# Patient Record
Sex: Female | Born: 1985
Health system: Southern US, Community
[De-identification: ages and names within clinical notes are randomized; demographics above are authoritative.]

## PROBLEM LIST (undated history)

## (undated) ENCOUNTER — Inpatient Hospital Stay (HOSPITAL_COMMUNITY): Payer: Self-pay

## (undated) DIAGNOSIS — R87629 Unspecified abnormal cytological findings in specimens from vagina: Secondary | ICD-10-CM

## (undated) DIAGNOSIS — A6000 Herpesviral infection of urogenital system, unspecified: Secondary | ICD-10-CM

## (undated) DIAGNOSIS — IMO0002 Reserved for concepts with insufficient information to code with codable children: Secondary | ICD-10-CM

## (undated) HISTORY — DX: Herpesviral infection of urogenital system, unspecified: A60.00

## (undated) HISTORY — DX: Reserved for concepts with insufficient information to code with codable children: IMO0002

---
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
## 2007-11-02 ENCOUNTER — Inpatient Hospital Stay (HOSPITAL_COMMUNITY): Admission: AD | Admit: 2007-11-02 | Discharge: 2007-11-02 | Payer: Self-pay | Admitting: Obstetrics

## 2007-11-08 ENCOUNTER — Inpatient Hospital Stay (HOSPITAL_COMMUNITY): Admission: AD | Admit: 2007-11-08 | Discharge: 2007-11-13 | Payer: Self-pay | Admitting: Obstetrics

## 2007-11-08 ENCOUNTER — Encounter: Payer: Self-pay | Admitting: Obstetrics

## 2007-11-14 ENCOUNTER — Observation Stay (HOSPITAL_COMMUNITY): Admission: AD | Admit: 2007-11-14 | Discharge: 2007-11-15 | Payer: Self-pay | Admitting: Obstetrics

## 2008-08-30 ENCOUNTER — Ambulatory Visit (HOSPITAL_COMMUNITY): Admission: RE | Admit: 2008-08-30 | Discharge: 2008-08-30 | Payer: Self-pay | Admitting: Emergency Medicine

## 2009-10-10 ENCOUNTER — Emergency Department (HOSPITAL_COMMUNITY): Admission: EM | Admit: 2009-10-10 | Discharge: 2009-10-10 | Payer: Self-pay | Admitting: Emergency Medicine

## 2009-10-10 ENCOUNTER — Encounter (INDEPENDENT_AMBULATORY_CARE_PROVIDER_SITE_OTHER): Payer: Self-pay | Admitting: *Deleted

## 2010-02-10 ENCOUNTER — Encounter (INDEPENDENT_AMBULATORY_CARE_PROVIDER_SITE_OTHER): Payer: Self-pay | Admitting: *Deleted

## 2010-08-31 NOTE — Letter (Signed)
Summary: New Patient letter  [facility]  [address] [place], [place] [address]   Phone: [phone]  Fax: [phone]       02/10/2010 MRN: [id number]  Cheryl Stewart [address] [place], [place]  [address]  Dear Ms[facility],  Welcome to the Gastroenterology Division at [facility].    You are scheduled to see Dr.  Jarold Motto on 03-23-10 at 9:30a.m. on the 3rd floor at [facility], [address].  We ask that you try to arrive at our office 15 minutes prior to your appointment time to allow for check-in.  We would like you to complete the enclosed self-administered evaluation form prior to your visit and bring it with you on the day of your appointment.  We will review it with you.  Also, please bring a complete list of all your medications or, if you prefer, bring the medication bottles and we will list them.  Please bring your insurance card so that we may make a copy of it.  If your insurance requires a referral to see a specialist, please bring your referral form from your primary care physician.  Co-payments are due at the time of your visit and may be paid by cash, check or credit card.     Your office visit will consist of a consult with your physician (includes a physical exam), any laboratory testing he/she may order, scheduling of any necessary diagnostic testing (e.g. x-ray, ultrasound, CT-scan), and scheduling of a procedure (e.g. Endoscopy, Colonoscopy) if required.  Please allow enough time on your schedule to allow for any/all of these possibilities.    If you cannot keep your appointment, please call [phone] to cancel or reschedule prior to your appointment date.  This allows [place] the opportunity to schedule an appointment for another patient in need of care.  If you do not cancel or reschedule by 5 p.m. the business day prior to your appointment date, you will be charged a $50.00 late cancellation/no-show fee.    Thank you for  choosing [facility] for your medical needs.  We appreciate the opportunity to care for you.  Please visit [place] at our website  to learn more about our practice.                     Sincerely,                                                             The Gastroenterology Division

## 2010-10-25 LAB — DIFFERENTIAL
Basophils Absolute: 0 10*3/uL (ref 0.0–0.1)
Basophils Relative: 0 % (ref 0–1)
Eosinophils Absolute: 0 10*3/uL (ref 0.0–0.7)
Eosinophils Relative: 1 % (ref 0–5)
Lymphocytes Relative: 21 % (ref 12–46)
Lymphs Abs: 0.8 10*3/uL (ref 0.7–4.0)
Monocytes Absolute: 0.4 10*3/uL (ref 0.1–1.0)
Monocytes Relative: 11 % (ref 3–12)
Neutro Abs: 2.5 10*3/uL (ref 1.7–7.7)
Neutrophils Relative %: 68 % (ref 43–77)

## 2010-10-25 LAB — CBC
HCT: 35.8 % — ABNORMAL LOW (ref 36.0–46.0)
Hemoglobin: 12 g/dL (ref 12.0–15.0)
MCHC: 33.6 g/dL (ref 30.0–36.0)
MCV: 80.5 fL (ref 78.0–100.0)
Platelets: 142 10*3/uL — ABNORMAL LOW (ref 150–400)
RBC: 4.45 MIL/uL (ref 3.87–5.11)
RDW: 14.1 % (ref 11.5–15.5)
WBC: 3.7 10*3/uL — ABNORMAL LOW (ref 4.0–10.5)

## 2010-10-25 LAB — COMPREHENSIVE METABOLIC PANEL
ALT: 15 U/L (ref 0–35)
AST: 26 U/L (ref 0–37)
Albumin: 3.7 g/dL (ref 3.5–5.2)
Alkaline Phosphatase: 54 U/L (ref 39–117)
BUN: 7 mg/dL (ref 6–23)
CO2: 23 mEq/L (ref 19–32)
Calcium: 8.7 mg/dL (ref 8.4–10.5)
Chloride: 104 mEq/L (ref 96–112)
Creatinine, Ser: 0.75 mg/dL (ref 0.4–1.2)
GFR calc Af Amer: >60 mL/min (ref >60)
GFR calc non Af Amer: >60 mL/min (ref >60)
Glucose, Bld: 93 mg/dL (ref 70–99)
Potassium: 3.5 mEq/L (ref 3.5–5.1)
Sodium: 134 mEq/L — ABNORMAL LOW (ref 135–145)
Total Bilirubin: 0.6 mg/dL (ref 0.3–1.2)
Total Protein: 7.3 g/dL (ref 6.0–8.3)

## 2010-10-25 LAB — POCT PREGNANCY, URINE: Preg Test, Ur: NEGATIVE

## 2010-10-25 LAB — URINALYSIS, ROUTINE W REFLEX MICROSCOPIC
Bilirubin Urine: NEGATIVE
Glucose, UA: NEGATIVE mg/dL
Hgb urine dipstick: NEGATIVE
Ketones, ur: NEGATIVE mg/dL
Nitrite: NEGATIVE
Protein, ur: 30 mg/dL — AB
Specific Gravity, Urine: 1.028 (ref 1.005–1.030)
Urobilinogen, UA: 1 mg/dL (ref 0.0–1.0)
pH: 7 (ref 5.0–8.0)

## 2010-10-25 LAB — URINE MICROSCOPIC-ADD ON

## 2010-12-14 NOTE — Op Note (Signed)
Cheryl Stewart, CARE                ACCOUNT NO.:  [id number]   MEDICAL RECORD NO.:  [id number]          PATIENT TYPE:  INP   LOCATION:  9176                          FACILITY:  [facility]   PHYSICIAN:  Kathreen Cosier, M.D.DATE OF BIRTH:  [birth year]   DATE OF PROCEDURE:  11/10/2007  DATE OF DISCHARGE:                               OPERATIVE REPORT   PREOPERATIVE DIAGNOSIS:  Failure to progress in labor.   POSTOPERATIVE DIAGNOSIS:  Failure to progress in labor.   ANESTHESIA:  Spinal.   SURGEON:  Kathreen Cosier, M.D.   PROCEDURE:  Patient placed on the operating room table in a supine  position after the spinal administered.  Abdomen prepped and draped.  Bladder emptied with a Foley catheter.  A transverse suprapubic incision  made, carried down to the rectus fascia.  The fascia clean and incised  the length of the incision.  The recti muscles retracted laterally.  Peritoneum incised longitudinally.  A transverse incision made in the  visceroperitoneum of the bladder.  The bladder mobilized inferiorly.  Transverse low uterine incision made.  The patient delivered from the  LOA position of a female, Apgars 9, 9, weighing 8 pounds, 10 ounces.  The team was in attendance.  Fluid clear.  Placenta anterior and removed  manually and sent to labor and delivery.  Uterine cavity cleaned with  dry laps.  Uterine incision closed in one layer with continuous suture  of #1 chromic.  Hemostasis satisfactory.  Bladder flap reattached with 2-  0 chromic.  Uterus well contracted.  Tubes and ovaries normal.  Abdomen  closed in layers.  Peritoneum, continuous suture of 0 chromic fascia.  Continuous suture of 0 Dexon.  The skin closed with a subcuticular  stitch of 4-0 Monocryl.  Blood loss 800 cc.  Patient tolerated the  procedure well, taken to the recovery room in good condition.           ______________________________  Kathreen Cosier, M.D.     BAM/MEDQ  D:  11/10/2007  T:  11/10/2007   Job:  [id number]

## 2010-12-14 NOTE — H&P (Signed)
Cheryl Stewart, BENN NO.:  [id number]   MEDICAL RECORD NO.:  [id number]          PATIENT TYPE:  INP   LOCATION:  9176                          FACILITY:  [facility]   PHYSICIAN:  Kathreen Cosier, M.D.DATE OF BIRTH:  September 03, 1985   DATE OF ADMISSION:  11/08/2007  DATE OF DISCHARGE:                              HISTORY & PHYSICAL   HISTORY:  The patient is a 25 year old gravida 1, EDC October 30, 2007,  history of herpes on Valtrex.  She has had no lesions.  The patient  should have been induced on November 06, 2007, but refused.  Nonstress test  on the day of admission showed that she had some late decelerations.  Cervix was 1 cm, vertex -3 and she was contracting irregularly.  She was  given Cervidil the night of admission and she was contracting mildly on  November 09, 2007.  By 2 p.m. on November 09, 2007, she was in good labor.  Cervix was 1 cm, 80% vertex, -3. Fluid was clear.  The patient  progressed to 4 cm dilated.  By November 10, 2007, she had been in good  labor since 2 p.m. the day prior to admission.  Her cervix had not  changed from 4 cm over 6 hours.  The vertex was still -3.  It was  decided she would be delivered by C-section because of failure to  progress, failed induction.   LABORATORY DATA:  Hemoglobin 13.3, platelets 156.   PHYSICAL EXAMINATION:  GENERAL:  Well-developed female in labor.  HEENT:  Negative.  LUNGS:  Clear.  HEART:  Regular rhythm.  No murmurs or gallops.  BREASTS:  No masses.  ABDOMEN:  Term-sized uterus.  Estimated fetal weight 8 pounds and 8  ounces.  PELVIC:  As described above.  EXTREMITIES:  Negative.           ______________________________  Kathreen Cosier, M.D.     BAM/MEDQ  D:  11/10/2007  T:  11/10/2007  Job:  [id number]

## 2010-12-17 NOTE — Discharge Summary (Signed)
NAMETAJHA, SAMMARCO NO.:  [id number]   MEDICAL RECORD NO.:  [id number]           PATIENT TYPE:   LOCATION:                                 FACILITY:   PHYSICIAN:  Kathreen Cosier, M.D.DATE OF BIRTH:  01-20-1986   DATE OF ADMISSION:  DATE OF DISCHARGE:                               DISCHARGE SUMMARY   The patient is a 25 year old female gravida 1, EDC October 30, 2007, with  a history of herpes, was on Valtrex.  She was brought in for induction  because of post dates.  She had Cervidil and was in prolonged adequate  labor and eventually had a C-section for 8 pounds 10 ounces female,  Apgar 9 and 9.  Postop, she had urinary retention and had to be  catheterized every 6 hours.  Her hemoglobin postop was 9.5.  She was  started on Urecholine 25 mg p.o. q.i.d. and by third postop day residual  was less than 100.  She was discharged home on the third postop day on  Tylox for pain and Urecholine for urinary retention, to see me in 6  weeks.  Discharged home.   DIAGNOSES:  Status post primary low transverse cesarean section at term  for cephalopelvic disproportion and urinary retention.           ______________________________  Kathreen Cosier, M.D.     BAM/MEDQ  D:  12/12/2007  T:  12/12/2007  Job:  [id number]

## 2011-01-30 DIAGNOSIS — IMO0002 Reserved for concepts with insufficient information to code with codable children: Secondary | ICD-10-CM

## 2011-01-30 HISTORY — DX: Reserved for concepts with insufficient information to code with codable children: IMO0002

## 2011-01-30 HISTORY — PX: COLPOSCOPY: SHX161

## 2011-01-31 ENCOUNTER — Ambulatory Visit: Payer: Self-pay | Admitting: Gynecology

## 2011-01-31 ENCOUNTER — Other Ambulatory Visit: Payer: Self-pay | Admitting: Gynecology

## 2011-02-03 ENCOUNTER — Ambulatory Visit (INDEPENDENT_AMBULATORY_CARE_PROVIDER_SITE_OTHER): Payer: 59 | Admitting: Gynecology

## 2011-02-03 ENCOUNTER — Ambulatory Visit: Payer: Self-pay | Admitting: Gynecology

## 2011-02-03 DIAGNOSIS — N870 Mild cervical dysplasia: Secondary | ICD-10-CM

## 2011-04-26 LAB — URINALYSIS, ROUTINE W REFLEX MICROSCOPIC
Bilirubin Urine: NEGATIVE
Glucose, UA: NEGATIVE
Ketones, ur: NEGATIVE
Leukocytes, UA: NEGATIVE
Nitrite: NEGATIVE
Protein, ur: NEGATIVE
Specific Gravity, Urine: <1.005 — ABNORMAL LOW
Urobilinogen, UA: 0.2
pH: 5.5

## 2011-04-26 LAB — CBC
HCT: 27.1 — ABNORMAL LOW
HCT: 38.8
HCT: 38.9
Hemoglobin: 13.3
Hemoglobin: 13.4
Hemoglobin: 9.5 — ABNORMAL LOW
MCHC: 34.3
MCHC: 34.4
MCHC: 35.1
MCV: 85.2
MCV: 85.7
MCV: 86.5
Platelets: 118 — ABNORMAL LOW
Platelets: 156
Platelets: 169
RBC: 3.13 — ABNORMAL LOW
RBC: 4.55
RBC: 4.55
RDW: 14.4
RDW: 14.7
RDW: 14.7
WBC: 7.8
WBC: 9.3
WBC: 9.7

## 2011-04-26 LAB — COMPREHENSIVE METABOLIC PANEL
ALT: 9
ALT: 10
AST: 22
AST: 20
Albumin: 2.7 — ABNORMAL LOW
Albumin: 2 — ABNORMAL LOW
Alkaline Phosphatase: 136 — ABNORMAL HIGH
Alkaline Phosphatase: 94
BUN: 5 — ABNORMAL LOW
BUN: 8
CO2: 20
CO2: 26
Calcium: 9.5
Calcium: 8.4
Chloride: 105
Chloride: 110
Creatinine, Ser: 0.7
Creatinine, Ser: 1.27 — ABNORMAL HIGH
GFR calc Af Amer: >60
GFR calc Af Amer: >60
GFR calc non Af Amer: >60
GFR calc non Af Amer: 53 — ABNORMAL LOW
Glucose, Bld: 82
Glucose, Bld: 97
Potassium: 4.1
Potassium: 3.7
Sodium: 135
Sodium: 144
Total Bilirubin: 0.8
Total Bilirubin: 0.7
Total Protein: 6.2
Total Protein: 5.3 — ABNORMAL LOW

## 2011-04-26 LAB — LACTATE DEHYDROGENASE: LDH: 198

## 2011-04-26 LAB — URIC ACID: Uric Acid, Serum: 6.3

## 2011-04-26 LAB — URINE MICROSCOPIC-ADD ON

## 2011-04-26 LAB — RPR: RPR Ser Ql: NONREACTIVE

## 2011-07-19 ENCOUNTER — Other Ambulatory Visit (HOSPITAL_COMMUNITY)
Admission: RE | Admit: 2011-07-19 | Discharge: 2011-07-19 | Disposition: A | Discharge status: Home / Self Care | Payer: 59 | Source: Ambulatory Visit | Attending: Gynecology | Admitting: Gynecology

## 2011-07-19 ENCOUNTER — Encounter: Payer: Self-pay | Admitting: *Deleted

## 2011-07-19 ENCOUNTER — Ambulatory Visit (INDEPENDENT_AMBULATORY_CARE_PROVIDER_SITE_OTHER): Payer: 59 | Admitting: Gynecology

## 2011-07-19 DIAGNOSIS — IMO0002 Reserved for concepts with insufficient information to code with codable children: Secondary | ICD-10-CM

## 2011-07-19 DIAGNOSIS — B3731 Acute candidiasis of vulva and vagina: Secondary | ICD-10-CM

## 2011-07-19 DIAGNOSIS — N898 Other specified noninflammatory disorders of vagina: Secondary | ICD-10-CM

## 2011-07-19 DIAGNOSIS — R87612 Low grade squamous intraepithelial lesion on cytologic smear of cervix (LGSIL): Secondary | ICD-10-CM

## 2011-07-19 DIAGNOSIS — R109 Unspecified abdominal pain: Secondary | ICD-10-CM

## 2011-07-19 DIAGNOSIS — Z01419 Encounter for gynecological examination (general) (routine) without abnormal findings: Secondary | ICD-10-CM | POA: Insufficient documentation

## 2011-07-19 DIAGNOSIS — B373 Candidiasis of vulva and vagina: Secondary | ICD-10-CM

## 2011-07-19 MED ORDER — FLUCONAZOLE 150 MG PO TABS: 150.0000 mg | ORAL_TABLET | Freq: Once | ORAL | Status: AC

## 2011-07-19 NOTE — Patient Instructions (Signed)
Schedule and follow up for pelvic ultrasound. If Pap smear is normal or mild atypia then recommend repeat in 6 months when you're due for your annual exam.

## 2011-07-19 NOTE — Progress Notes (Signed)
Patient presents with a several week history of pelvic pain intermittently throughout the day it lasts minutes primarily in the left lower quadrant pelvic region. Also notes with intercourse she is having pain with deep penetration with every episode. She has no nausea or vomiting, diarrhea or constipation. No urinary symptoms such as frequency, dysuria or urgency. No fevers chills, no abnormal vaginal discharge, itching or odor. Menses are regular uses condom contraception. No history of similar pain in the past. She's actually due for her Pap smear repeat now at a six-month interval with low-grade SIL on colposcopy biopsy in July.  Exam chaperone present Spine straight no CVA tenderness Abdomen soft nontender without masses guarding rebound organomegaly with active bowel sounds. Pelvic external BUS vagina with white discharge, cervix normal, uterus anteverted normal size midline mobile nontender, adnexa without masses or tenderness, rectovaginal exam is normal.  Assessment and plan 1. White discharge. KOH wet prep is positive for yeast. We'll treat with Diflucan 150x1 dose follow up if symptoms develop. 2. Pelvic pain/dyspareunia. Of recent development. No history of this before. I reviewed with her she has a normal physical exam, my suspicion is she may have a small ovarian cyst, functional, that is responsible for her pain. I did a GC Chlamydia screen of the cervix. Her urinalysis is negative. She has no other symptoms to suggest other organ sites such as GI/GU. We'll start with pelvic ultrasound and she'll schedule that and follow up with me afterwards and then we'll go from there. Possibilities for laparoscopy for persistent or worsening pain was discussed with her. 3. Low-grade SIL. Pap smear was done today. Assuming normal or low grade then we'll plan repeat in the summer when she is due for her annual. If high grade then we'll triage based on results.

## 2011-07-19 NOTE — Progress Notes (Signed)
Addended by: Richardson Chiquito on: 07/19/2011 12:08 PM   Modules accepted: Orders

## 2011-07-20 LAB — GC/CHLAMYDIA PROBE AMP, GENITAL
Chlamydia, DNA Probe: NEGATIVE
GC Probe Amp, Genital: NEGATIVE

## 2011-08-08 ENCOUNTER — Encounter: Payer: Self-pay | Admitting: Gynecology

## 2011-08-08 ENCOUNTER — Ambulatory Visit (INDEPENDENT_AMBULATORY_CARE_PROVIDER_SITE_OTHER): Payer: 59 | Admitting: Gynecology

## 2011-08-08 ENCOUNTER — Ambulatory Visit (INDEPENDENT_AMBULATORY_CARE_PROVIDER_SITE_OTHER): Payer: 59

## 2011-08-08 DIAGNOSIS — R109 Unspecified abdominal pain: Secondary | ICD-10-CM

## 2011-08-08 DIAGNOSIS — N831 Corpus luteum cyst of ovary, unspecified side: Secondary | ICD-10-CM

## 2011-08-08 DIAGNOSIS — IMO0002 Reserved for concepts with insufficient information to code with codable children: Secondary | ICD-10-CM

## 2011-08-08 DIAGNOSIS — N949 Unspecified condition associated with female genital organs and menstrual cycle: Secondary | ICD-10-CM

## 2011-08-08 DIAGNOSIS — R102 Pelvic and perineal pain: Secondary | ICD-10-CM

## 2011-08-08 DIAGNOSIS — N83209 Unspecified ovarian cyst, unspecified side: Secondary | ICD-10-CM

## 2011-08-08 MED ORDER — FLUCONAZOLE 150 MG PO TABS: 150.0000 mg | ORAL_TABLET | Freq: Once | ORAL | Status: AC

## 2011-08-08 NOTE — Progress Notes (Addendum)
Patient presents for ultrasound do to her pelvic pain, dyspareunia. Ultrasound does show a hemorrhagic appearing left ovarian cyst 33 mm greatest dimension, 30 mm mean with a reticular echo pattern. Right ovary normal prominent endometrial canal in 15 mm noting that she is in her luteal phase. Her Pap smear from her visit did return normal.  Assessment and plan: 1. Pelvic pain/dyspareunia. Ultrasound does show a hemorrhagic cyst on the right. Differential to include endometriosis was reviewed with her. Recommend keeping a pain calender and repeat ultrasound in 2-3 months. If pain persists or cyst persists then discussed laparoscopy. If the cyst resolves and pain resolves then we'll plan active management. 2. Low-grade dysplasia. Pap smear returned normal and I recommended she repeat her Pap smear in the summer at a six-month interval she agrees with this. 3. Yeast vaginitis. Patient said that her pharmacy never got the Diflucan prescription from her previous visit although it has been documented it was e-prescribed. I reordered a Diflucan 150 mg a day and she knows to call me if there is any issue with this arriving at the pharmacy.

## 2011-08-08 NOTE — Patient Instructions (Signed)
Keep track of your pain. Repeat ultrasound in 2-3 months. If pain or ovarian cyst persists then we'll proceed with laparoscopy. Repeat Pap smear in 6 months.

## 2011-08-09 ENCOUNTER — Telehealth: Payer: Self-pay | Admitting: *Deleted

## 2011-08-09 MED ORDER — IBUPROFEN 800 MG PO TABS: 800.0000 mg | ORAL_TABLET | Freq: Three times a day (TID) | ORAL | Status: AC | PRN

## 2011-08-09 NOTE — Telephone Encounter (Signed)
I would suggest starting with prescription strength Motrin 800 mg Q8 hours when necessary pain #30 2 refills prescribed

## 2011-08-09 NOTE — Telephone Encounter (Signed)
Pt seen yesterday for pelvic pain for ultrasound. Pt said that she is still having sharp pain, pt keep asking about a prescription for the pain and I told her that the rx that was sent was for her diflucan from previous visit. I explained to pt what you had in office note what the plan and assessment. Pt wants to know what can she do now to stop pain, OTC medication are not working. Please advise

## 2011-08-09 NOTE — Telephone Encounter (Signed)
Left the below message on pt vm with the below note

## 2011-08-10 ENCOUNTER — Telehealth: Payer: Self-pay | Admitting: *Deleted

## 2011-08-10 MED ORDER — TRAMADOL HCL 50 MG PO TABS: 50.0000 mg | ORAL_TABLET | Freq: Four times a day (QID) | ORAL | Status: AC | PRN

## 2011-08-10 NOTE — Telephone Encounter (Signed)
We will try Ultram 50 mg by mouth every 6 hours when necessary pain #30 no refill

## 2011-08-10 NOTE — Telephone Encounter (Signed)
Pt informed with the below note. 

## 2011-08-10 NOTE — Telephone Encounter (Signed)
Pt called was seen on 08/08/11 for pelvic pain and had ultrasound done, pt called yesterday c/o same pain and was given motrin 800 mg 1 every 8 hours as needed. Pt called again today saying her pain hurts too bad, she said the motrin is not working. She is taking every 8 hour and no relief, said it hurts to even cough. Please advise

## 2013-04-20 ENCOUNTER — Ambulatory Visit: Payer: BC Managed Care – PPO | Admitting: Family Medicine

## 2013-04-20 ENCOUNTER — Other Ambulatory Visit: Payer: Self-pay | Admitting: *Deleted

## 2013-04-20 VITALS — BP 112/64 | HR 103 | Temp 99.4°F | Resp 17 | Ht 62.5 in | Wt 158.0 lb

## 2013-04-20 DIAGNOSIS — J329 Chronic sinusitis, unspecified: Secondary | ICD-10-CM

## 2013-04-20 MED ORDER — FLUCONAZOLE 150 MG PO TABS: 150.0000 mg | ORAL_TABLET | Freq: Once | ORAL | Status: DC

## 2013-04-20 MED ORDER — AMOXICILLIN 875 MG PO TABS: 875.0000 mg | ORAL_TABLET | Freq: Two times a day (BID) | ORAL | Status: DC

## 2013-04-20 NOTE — Progress Notes (Signed)
    Patient ID: Cheryl Stewart MRN: [id number], DOB: 1986-03-06, 27 y.o. Date of Encounter: 04/20/2013, 2:08 PM  Primary Physician: No PCP Per Patient  Chief Complaint:  Chief Complaint  Patient presents with  . Headache  . Dizziness    HPI: 27 y.o. year old female presents with 7 day history of nasal congestion, post nasal drip, sore throat, sinus pressure, and cough. Afebrile. No chills. Nasal congestion thick and green/yellow. Sinus pressure is the worst symptom. Cough is productive secondary to post nasal drip and not associated with time of day. Ears feel full, leading to sensation of muffled hearing. Has tried OTC cold preps without success. No GI complaints.  Patient is from [place].  She has 27 yo kindergartner.  Works for [employer] at [employer]. No recent antibiotics, recent travels, or sick contacts   No leg trauma, sedentary periods, h/o cancer, or tobacco use.  Past Medical History  Diagnosis Date  . LGSIL (low grade squamous intraepithelial dysplasia) 07/12     Home Meds: Prior to Admission medications   Not on File    Allergies: No Known Allergies  History   Social History  . Marital Status: Single    Spouse Name: N/A    Number of Children: N/A  . Years of Education: N/A   Occupational History  . Not on file.   Social History Main Topics  . Smoking status: Never Smoker   . Smokeless tobacco: Never Used  . Alcohol Use: No  . Drug Use: No  . Sexual Activity: Yes    Birth Control/ Protection: Condom   Other Topics Concern  . Not on file   Social History Narrative  . No narrative on file     Review of Systems: Constitutional: negative for chills, fever, night sweats or weight changes Cardiovascular: negative for chest pain or palpitations Respiratory: negative for hemoptysis, wheezing, or shortness of breath Abdominal: negative for abdominal pain, nausea, vomiting or diarrhea Dermatological: negative for rash Neurologic: negative for  headache   Physical Exam: Blood pressure 112/64, pulse 103, temperature 99.4 F (37.4 C), temperature source Oral, resp. rate 17, height 5' 2.5" (1.588 m), weight 158 lb (71.668 kg), SpO2 99.00%., Body mass index is 28.42 kg/(m^2). General: Well developed, well nourished, in no acute distress. Head: Normocephalic, atraumatic, eyes without discharge, sclera non-icteric, nares are congested. Bilateral auditory canals clear, TM's are without perforation, pearly grey with reflective cone of light bilaterally. Serous effusion bilaterally behind TM's. Maxillary sinus TTP. Oral cavity moist, dentition normal. Posterior pharynx with post nasal drip and mild erythema. No peritonsillar abscess or tonsillar exudate. Neck: Supple. No thyromegaly. Full ROM. No lymphadenopathy. Lungs: Clear bilaterally to auscultation without wheezes, rales, or rhonchi. Breathing is unlabored.  Heart: RRR with S1 S2. No murmurs, rubs, or gallops appreciated. Msk:  Strength and tone normal for age. Extremities: No clubbing or cyanosis. No edema. Neuro: Alert and oriented X 3. Moves all extremities spontaneously. CNII-XII grossly in tact. Psych:  Responds to questions appropriately with a normal affect.    ASSESSMENT AND PLAN:  27 y.o. year old female with sinusitis  -  -Tylenol/Motrin prn -Rest/fluids -RTC precautions -RTC 3-5 days if no improvement  Signed, Elvina Sidle, MD 04/20/2013 2:08 PM

## 2013-04-20 NOTE — Patient Instructions (Addendum)

## 2014-02-14 ENCOUNTER — Observation Stay (HOSPITAL_COMMUNITY)
Admission: EM | Admit: 2014-02-14 | Discharge: 2014-02-15 | Disposition: A | Discharge status: Home / Self Care | Payer: BC Managed Care – PPO | Attending: Obstetrics | Admitting: Obstetrics

## 2014-02-14 ENCOUNTER — Encounter (HOSPITAL_COMMUNITY): Payer: Self-pay | Admitting: Emergency Medicine

## 2014-02-14 DIAGNOSIS — M79609 Pain in unspecified limb: Secondary | ICD-10-CM | POA: Insufficient documentation

## 2014-02-14 DIAGNOSIS — R109 Unspecified abdominal pain: Secondary | ICD-10-CM

## 2014-02-14 DIAGNOSIS — Z331 Pregnant state, incidental: Secondary | ICD-10-CM | POA: Diagnosis not present

## 2014-02-14 DIAGNOSIS — Z3493 Encounter for supervision of normal pregnancy, unspecified, third trimester: Secondary | ICD-10-CM

## 2014-02-14 DIAGNOSIS — R1032 Left lower quadrant pain: Principal | ICD-10-CM | POA: Insufficient documentation

## 2014-02-14 MED ORDER — PRENATAL MULTIVITAMIN CH
1.0000 | ORAL_TABLET | Freq: Every day | ORAL | Status: DC
Start: 2014-02-15 — End: 2014-02-15

## 2014-02-14 MED ORDER — ZOLPIDEM TARTRATE 5 MG PO TABS: 5.0000 mg | ORAL_TABLET | Freq: Every evening | ORAL | Status: DC | PRN

## 2014-02-14 MED ORDER — ACETAMINOPHEN 325 MG PO TABS: 650.0000 mg | ORAL_TABLET | ORAL | Status: DC | PRN

## 2014-02-14 MED ORDER — SODIUM CHLORIDE 0.9 % IJ SOLN
3.0000 mL | Freq: Two times a day (BID) | INTRAMUSCULAR | Status: DC
Start: 2014-02-14 — End: 2014-02-15
  Administered 2014-02-14: 3 mL via INTRAVENOUS

## 2014-02-14 MED ORDER — SODIUM CHLORIDE 0.9 % IJ SOLN: 3.0000 mL | INTRAMUSCULAR | Status: DC | PRN

## 2014-02-14 MED ORDER — CALCIUM CARBONATE ANTACID 500 MG PO CHEW
2.0000 | CHEWABLE_TABLET | ORAL | Status: DC | PRN
Start: 2014-02-14 — End: 2014-02-15
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
## 2014-02-14 NOTE — ED Notes (Signed)
[facility] called to transport patient. [facility] nurse at bedside. Pt resting quietly at the time. Vital signs stable. No signs of distress noted.

## 2014-02-14 NOTE — ED Notes (Signed)
[facility] at bedside to transport patient. Vital signs stable. All required paperwork and transfer information sent with patient.
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
## 2014-02-14 NOTE — ED Notes (Signed)
[facility]apid [facility] nurse at bedside 

## 2014-02-14 NOTE — ED Notes (Signed)
NO IV fluids ordered at the time per Dr. Venora Maples.
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
## 2014-02-15 DIAGNOSIS — R1032 Left lower quadrant pain: Secondary | ICD-10-CM | POA: Diagnosis not present

## 2014-02-15 NOTE — Discharge Instructions (Signed)
Fetal Movement Counts Patient Name: __________________________________________________ Patient Due Date: ____________________ Performing a fetal movement count is highly recommended in high-risk pregnancies, but it is good for every pregnant woman to do. Your caregiver may ask you to start counting fetal movements at 28 weeks of the pregnancy. Fetal movements often increase:  After eating a full meal.  After physical activity.  After eating or drinking something sweet or cold.  At rest. Pay attention to when you feel the baby is most active. This will help you notice a pattern of your baby's sleep and wake cycles and what factors contribute to an increase in fetal movement. It is important to perform a fetal movement count at the same time each day when your baby is normally most active.  HOW TO COUNT FETAL MOVEMENTS 1. Find a quiet and comfortable area to sit or lie down on your left side. Lying on your left side provides the best blood and oxygen circulation to your baby. 2. Write down the day and time on a sheet of paper or in a journal. 3. Start counting kicks, flutters, swishes, rolls, or jabs in a 2 hour period. You should feel at least 10 movements within 2 hours. 4. If you do not feel 10 movements in 2 hours, wait 2-3 hours and count again. Look for a change in the pattern or not enough counts in 2 hours. SEEK MEDICAL CARE IF:  You feel less than 10 counts in 2 hours, tried twice.  There is no movement in over an hour.  The pattern is changing or taking longer each day to reach 10 counts in 2 hours.  You feel the baby is not moving as he or she usually does. Date: ____________ Movements: ____________ Start time: ____________ Elizebeth Koller time: ____________  Date: ____________ Movements: ____________ Start time: ____________ Elizebeth Koller time: ____________ Date: ____________ Movements: ____________ Start time: ____________ Elizebeth Koller time: ____________ Date: ____________ Movements: ____________  Start time: ____________ Elizebeth Koller time: ____________ Date: ____________ Movements: ____________ Start time: ____________ Elizebeth Koller time: ____________ Date: ____________ Movements: ____________ Start time: ____________ Elizebeth Koller time: ____________ Date: ____________ Movements: ____________ Start time: ____________ Elizebeth Koller time: ____________ Date: ____________ Movements: ____________ Start time: ____________ Elizebeth Koller time: ____________  Date: ____________ Movements: ____________ Start time: ____________ Elizebeth Koller time: ____________ Date: ____________ Movements: ____________ Start time: ____________ Elizebeth Koller time: ____________ Date: ____________ Movements: ____________ Start time: ____________ Elizebeth Koller time: ____________ Date: ____________ Movements: ____________ Start time: ____________ Elizebeth Koller time: ____________ Date: ____________ Movements: ____________ Start time: ____________ Elizebeth Koller time: ____________ Date: ____________ Movements: ____________ Start time: ____________ Elizebeth Koller time: ____________ Date: ____________ Movements: ____________ Start time: ____________ Elizebeth Koller time: ____________  Date: ____________ Movements: ____________ Start time: ____________ Elizebeth Koller time: ____________ Date: ____________ Movements: ____________ Start time: ____________ Elizebeth Koller time: ____________ Date: ____________ Movements: ____________ Start time: ____________ Elizebeth Koller time: ____________ Date: ____________ Movements: ____________ Start time: ____________ Elizebeth Koller time: ____________ Date: ____________ Movements: ____________ Start time: ____________ Elizebeth Koller time: ____________ Date: ____________ Movements: ____________ Start time: ____________ Elizebeth Koller time: ____________ Date: ____________ Movements: ____________ Start time: ____________ Elizebeth Koller time: ____________  Date: ____________ Movements: ____________ Start time: ____________ Elizebeth Koller time: ____________ Date: ____________ Movements: ____________ Start time: ____________ Elizebeth Koller time:  ____________ Date: ____________ Movements: ____________ Start time: ____________ Elizebeth Koller time: ____________ Date: ____________ Movements: ____________ Start time: ____________ Elizebeth Koller time: ____________ Date: ____________ Movements: ____________ Start time: ____________ Elizebeth Koller time: ____________ Date: ____________ Movements: ____________ Start time: ____________ Elizebeth Koller time: ____________ Date: ____________ Movements: ____________ Start time: ____________ Elizebeth Koller time: ____________  Date: ____________ Movements: ____________ Start time: ____________ Elizebeth Koller time:  ____________ Date: ____________ Movements: ____________ Start time: ____________ Elizebeth Koller time: ____________ Date: ____________ Movements: ____________ Start time: ____________ Elizebeth Koller time: ____________ Date: ____________ Movements: ____________ Start time: ____________ Elizebeth Koller time: ____________ Date: ____________ Movements: ____________ Start time: ____________ Elizebeth Koller time: ____________ Date: ____________ Movements: ____________ Start time: ____________ Elizebeth Koller time: ____________ Date: ____________ Movements: ____________ Start time: ____________ Elizebeth Koller time: ____________  Date: ____________ Movements: ____________ Start time: ____________ Elizebeth Koller time: ____________ Date: ____________ Movements: ____________ Start time: ____________ Elizebeth Koller time: ____________ Date: ____________ Movements: ____________ Start time: ____________ Elizebeth Koller time: ____________ Date: ____________ Movements: ____________ Start time: ____________ Elizebeth Koller time: ____________ Date: ____________ Movements: ____________ Start time: ____________ Elizebeth Koller time: ____________ Date: ____________ Movements: ____________ Start time: ____________ Elizebeth Koller time: ____________ Date: ____________ Movements: ____________ Start time: ____________ Elizebeth Koller time: ____________  Date: ____________ Movements: ____________ Start time: ____________ Elizebeth Koller time: ____________ Date: ____________ Movements:  ____________ Start time: ____________ Elizebeth Koller time: ____________ Date: ____________ Movements: ____________ Start time: ____________ Elizebeth Koller time: ____________ Date: ____________ Movements: ____________ Start time: ____________ Elizebeth Koller time: ____________ Date: ____________ Movements: ____________ Start time: ____________ Elizebeth Koller time: ____________ Date: ____________ Movements: ____________ Start time: ____________ Elizebeth Koller time: ____________ Date: ____________ Movements: ____________ Start time: ____________ Elizebeth Koller time: ____________  Date: ____________ Movements: ____________ Start time: ____________ Elizebeth Koller time: ____________ Date: ____________ Movements: ____________ Start time: ____________ Elizebeth Koller time: ____________ Date: ____________ Movements: ____________ Start time: ____________ Elizebeth Koller time: ____________ Date: ____________ Movements: ____________ Start time: ____________ Elizebeth Koller time: ____________ Date: ____________ Movements: ____________ Start time: ____________ Elizebeth Koller time: ____________ Date: ____________ Movements: ____________ Start time: ____________ Elizebeth Koller time: ____________ Document Released: 08/17/2006 Document Revised: 07/04/2012 Document Reviewed: 05/14/2012 [employer] Patient Information 2015 [employer]. This information is not intended to replace advice given to you by your health care provider. Make sure you discuss any questions you have with your health care provider. Preterm Labor Information Preterm labor is when labor starts at less than 37 weeks of pregnancy. The normal length of a pregnancy is 39 to 41 weeks. CAUSES Often, there is no identifiable underlying cause as to why a woman goes into preterm labor. One of the most common known causes of preterm labor is infection. Infections of the uterus, cervix, vagina, amniotic sac, bladder, kidney, or even the lungs (pneumonia) can cause labor to start. Other suspected causes of preterm labor include:   Urogenital infections,  such as yeast infections and bacterial vaginosis.   Uterine abnormalities (uterine shape, uterine septum, fibroids, or bleeding from the placenta).   A cervix that has been operated on (it may fail to stay closed).   Malformations in the fetus.   Multiple gestations (twins, triplets, and so on).   Breakage of the amniotic sac.  RISK FACTORS  Having a previous history of preterm labor.   Having premature rupture of membranes (PROM).   Having a placenta that covers the opening of the cervix (placenta previa).   Having a placenta that separates from the uterus (placental abruption).   Having a cervix that is too weak to hold the fetus in the uterus (incompetent cervix).   Having too much fluid in the amniotic sac (polyhydramnios).   Taking illegal drugs or smoking while pregnant.   Not gaining enough weight while pregnant.   Being younger than 27 and older than 28 years old.   Having a low socioeconomic status.   Being African American. SYMPTOMS Signs and symptoms of preterm labor include:   Menstrual-like cramps, abdominal pain, or back pain.  Uterine contractions that are regular, as frequent as six in an hour,  regardless of their intensity (may be mild or painful).  Contractions that start on the top of the uterus and spread down to the lower abdomen and back.   A sense of increased pelvic pressure.   A watery or bloody mucus discharge that comes from the vagina.  TREATMENT Depending on the length of the pregnancy and other circumstances, your health care provider may suggest bed rest. If necessary, there are medicines that can be given to stop contractions and to mature the fetal lungs. If labor happens before 34 weeks of pregnancy, a prolonged hospital stay may be recommended. Treatment depends on the condition of both you and the fetus.  WHAT SHOULD YOU DO IF YOU THINK YOU ARE IN PRETERM LABOR? Call your health care provider right away. You will  need to go to the hospital to get checked immediately. HOW CAN YOU PREVENT PRETERM LABOR IN FUTURE PREGNANCIES? You should:   Stop smoking if you smoke.  Maintain healthy weight gain and avoid chemicals and drugs that are not necessary.  Be watchful for any type of infection.  Inform your health care provider if you have a known history of preterm labor. Document Released: 10/08/2003 Document Revised: 03/20/2013 Document Reviewed: 08/20/2012 [facility] Patient Information 2015 [place], [place]. This information is not intended to replace advice given to you by your health care provider. Make sure you discuss any questions you have with your health care provider.

## 2014-02-15 NOTE — Progress Notes (Signed)
Patient discharged home.
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
## 2014-03-19 ENCOUNTER — Inpatient Hospital Stay (HOSPITAL_COMMUNITY)
Admission: AD | Admit: 2014-03-19 | Discharge: 2014-03-19 | Disposition: A | Discharge status: Home / Self Care | Payer: Medicaid Other | Source: Ambulatory Visit | Attending: Obstetrics | Admitting: Obstetrics

## 2014-03-19 ENCOUNTER — Encounter (HOSPITAL_COMMUNITY): Payer: Self-pay | Admitting: *Deleted

## 2014-03-19 DIAGNOSIS — O9989 Other specified diseases and conditions complicating pregnancy, childbirth and the puerperium: Principal | ICD-10-CM

## 2014-03-19 DIAGNOSIS — R0602 Shortness of breath: Secondary | ICD-10-CM | POA: Diagnosis not present

## 2014-03-19 DIAGNOSIS — D649 Anemia, unspecified: Secondary | ICD-10-CM | POA: Diagnosis not present

## 2014-03-19 DIAGNOSIS — R5383 Other fatigue: Secondary | ICD-10-CM | POA: Diagnosis not present

## 2014-03-19 DIAGNOSIS — O99891 Other specified diseases and conditions complicating pregnancy: Secondary | ICD-10-CM | POA: Diagnosis not present

## 2014-03-19 DIAGNOSIS — R109 Unspecified abdominal pain: Secondary | ICD-10-CM | POA: Diagnosis present

## 2014-03-19 DIAGNOSIS — O99019 Anemia complicating pregnancy, unspecified trimester: Secondary | ICD-10-CM | POA: Insufficient documentation

## 2014-03-19 DIAGNOSIS — R5381 Other malaise: Secondary | ICD-10-CM | POA: Diagnosis not present

## 2014-03-19 DIAGNOSIS — N390 Urinary tract infection, site not specified: Secondary | ICD-10-CM

## 2014-03-19 LAB — URINE MICROSCOPIC-ADD ON

## 2014-03-19 LAB — URINALYSIS, ROUTINE W REFLEX MICROSCOPIC
Bilirubin Urine: NEGATIVE
Glucose, UA: NEGATIVE mg/dL
Hgb urine dipstick: NEGATIVE
Ketones, ur: NEGATIVE mg/dL
Leukocytes, UA: NEGATIVE
Nitrite: NEGATIVE
Protein, ur: 100 mg/dL — AB
Specific Gravity, Urine: 1.02 (ref 1.005–1.030)
Urobilinogen, UA: 0.2 mg/dL (ref 0.0–1.0)
pH: 7 (ref 5.0–8.0)

## 2014-03-19 LAB — CBC
HCT: 29.5 % — ABNORMAL LOW (ref 36.0–46.0)
Hemoglobin: 9.6 g/dL — ABNORMAL LOW (ref 12.0–15.0)
MCH: 25.9 pg — ABNORMAL LOW (ref 26.0–34.0)
MCHC: 32.5 g/dL (ref 30.0–36.0)
MCV: 79.5 fL (ref 78.0–100.0)
Platelets: 157 10*3/uL (ref 150–400)
RBC: 3.71 MIL/uL — ABNORMAL LOW (ref 3.87–5.11)
RDW: 14.7 % (ref 11.5–15.5)
WBC: 6.9 10*3/uL (ref 4.0–10.5)

## 2014-03-19 MED ORDER — NITROFURANTOIN MONOHYD MACRO 100 MG PO CAPS: 100.0000 mg | ORAL_CAPSULE | Freq: Two times a day (BID) | ORAL | Status: DC

## 2014-03-19 MED ORDER — FERROUS SULFATE 325 (65 FE) MG PO TBEC: 325.0000 mg | DELAYED_RELEASE_TABLET | Freq: Two times a day (BID) | ORAL | Status: DC

## 2014-03-19 NOTE — MAU Provider Note (Signed)
History     CSN: [id number]  Arrival date and time: 03/19/14 1151   None     Chief Complaint  Patient presents with  . Abdominal Pain   HPI This is a 28 y.o. female at [redacted]w[redacted]d who presents with c/o multiple somatic complaints. States had a sudden onset of malaise, shortness of breath, and weakness. Did not call Dr Ruthann Cancer, but instead called EMS. Feels better now but still feels weak. Denies contractions, leaking or bleeding. + FM.   States has eaten once today, some cereal. Has not drunk any fluids AT ALL all day.  States chews ice a lot. States very rarely drinks any fluids.  States has always been that way. Cannot say she drinks even one cup of water in a whole day.   RN Note  Patient states she does not think the pain she is feeling is contractions. States she feels SOB, hurts all over, feels weak. Was thinking it was because her iron is low.        OB History   Grav Para Term Preterm Abortions TAB SAB Ect Mult Living   4 1   2 2    1       Past Medical History  Diagnosis Date  . LGSIL (low grade squamous intraepithelial dysplasia) 07/12    Past Surgical History  Procedure Laterality Date  . Colposcopy  01/2011  . Cesarean section  2009    Family History  Problem Relation Age of Onset  . Hypertension Father     History  Substance Use Topics  . Smoking status: Never Smoker   . Smokeless tobacco: Never Used  . Alcohol Use: No    Allergies: No Known Allergies  Prescriptions prior to admission  Medication Sig Dispense Refill  . acetaminophen (TYLENOL) 500 MG tablet Take 500 mg by mouth every 6 (six) hours as needed for mild pain.      . calcium carbonate (TUMS - DOSED IN MG ELEMENTAL CALCIUM) 500 MG chewable tablet Chew 1 tablet by mouth as needed for indigestion or heartburn.        Review of Systems  Constitutional: Positive for malaise/fatigue. Negative for fever and chills.  Eyes: Negative for blurred vision.  Respiratory: Positive for shortness of  breath (previously, not now). Negative for cough and wheezing.   Cardiovascular: Negative for chest pain.  Gastrointestinal: Negative for heartburn, nausea, vomiting and abdominal pain.  Genitourinary: Negative for dysuria.  Musculoskeletal: Positive for myalgias.  Neurological: Positive for weakness. Negative for dizziness, sensory change, speech change, focal weakness, loss of consciousness and headaches.   Physical Exam   Blood pressure 107/62, pulse 91, temperature 98 F (36.7 C), temperature source Oral, SpO2 100.00%.  Physical Exam  Constitutional: She is oriented to person, place, and time. She appears well-developed and well-nourished. No distress.  HENT:  Head: Normocephalic.  Cardiovascular: Normal rate, regular rhythm and normal heart sounds.  Exam reveals no gallop and no friction rub.   No murmur heard. Respiratory: Effort normal and breath sounds normal. No respiratory distress. She has no wheezes. She has no rales. She exhibits no tenderness.  GI: Soft. Bowel sounds are normal. She exhibits no distension. There is no tenderness. There is no rebound and no guarding.  Genitourinary:  Dilation: Closed Effacement (%): Thick Cervical Position: Middle Station: Ballotable Presentation: Undeterminable Exam by:: Lavonna Rua, RNC   Musculoskeletal: Normal range of motion.  Neurological: She is alert and oriented to person, place, and time.  Skin: Skin is warm  and dry.  Psychiatric: She has a normal mood and affect.    MAU Course  Procedures  MDM Results for orders placed during the hospital encounter of 03/19/14 (from the past 24 hour(s))  URINALYSIS, ROUTINE W REFLEX MICROSCOPIC     Status: Abnormal   Collection Time    03/19/14  1:24 PM      Result Value Ref Range   Color, Urine YELLOW  YELLOW   APPearance CLEAR  CLEAR   Specific Gravity, Urine 1.020  1.005 - 1.030   pH 7.0  5.0 - 8.0   Glucose, UA NEGATIVE  NEGATIVE mg/dL   Hgb urine dipstick NEGATIVE  NEGATIVE    Bilirubin Urine NEGATIVE  NEGATIVE   Ketones, ur NEGATIVE  NEGATIVE mg/dL   Protein, ur 100 (*) NEGATIVE mg/dL   Urobilinogen, UA 0.2  0.0 - 1.0 mg/dL   Nitrite NEGATIVE  NEGATIVE   Leukocytes, UA NEGATIVE  NEGATIVE  URINE MICROSCOPIC-ADD ON     Status: Abnormal   Collection Time    03/19/14  1:24 PM      Result Value Ref Range   Squamous Epithelial / LPF FEW (*) RARE   WBC, UA 3-6  <3 WBC/hpf   RBC / HPF 3-6  <3 RBC/hpf   Bacteria, UA MANY (*) RARE   Urine-Other MUCOUS PRESENT    CBC     Status: Abnormal   Collection Time    03/19/14  1:30 PM      Result Value Ref Range   WBC 6.9  4.0 - 10.5 K/uL   RBC 3.71 (*) 3.87 - 5.11 MIL/uL   Hemoglobin 9.6 (*) 12.0 - 15.0 g/dL   HCT 29.5 (*) 36.0 - 46.0 %   MCV 79.5  78.0 - 100.0 fL   MCH 25.9 (*) 26.0 - 34.0 pg   MCHC 32.5  30.0 - 36.0 g/dL   RDW 14.7  11.5 - 15.5 %   Platelets 157  150 - 400 K/uL    NST reactive Rare contractions  Assessment and Plan  A:  SIUp at [redacted]w[redacted]d        Probable UTI       Malaise and weakness, probably due to Anemia and dehydration       Poor water intake       Anemia       Episode of shortness of breath, probably panic attack  P:  Dicussed with patient       Discharge home       Encouraged PO fluids intake       RX  Iron bid       Rx macrobid       Urine to culture       Followup with Dr Jefm Miles 03/19/2014, 1:26 PM

## 2014-03-19 NOTE — MAU Note (Signed)
Patient states she does not think the pain she is feeling is contractions. States she feels SOB, hurts all over, feels weak. Was thinking it was because her iron is low.

## 2014-03-19 NOTE — MAU Note (Signed)
Pt presents to MAU with complaints of having abdominal pain that started this morning and complains of having trouble catching her breath. Denies any vaginal bleeding or LOF

## 2014-03-19 NOTE — Discharge Instructions (Signed)
Dehydration, Adult °Dehydration is when you lose more fluids from the body than you take in. Vital organs like the kidneys, brain, and heart cannot function without a proper amount of fluids and salt. Any loss of fluids from the body can cause dehydration.  °CAUSES  °· Vomiting. °· Diarrhea. °· Excessive sweating. °· Excessive urine output. °· Fever. °SYMPTOMS  °Mild dehydration °· Thirst. °· Dry lips. °· Slightly dry mouth. °Moderate dehydration °· Very dry mouth. °· Sunken eyes. °· Skin does not bounce back quickly when lightly pinched and released. °· Dark urine and decreased urine production. °· Decreased tear production. °· Headache. °Severe dehydration °· Very dry mouth. °· Extreme thirst. °· Rapid, weak pulse (more than 100 beats per minute at rest). °· Cold hands and feet. °· Not able to sweat in spite of heat and temperature. °· Rapid breathing. °· Blue lips. °· Confusion and lethargy. °· Difficulty being awakened. °· Minimal urine production. °· No tears. °DIAGNOSIS  °Your caregiver will diagnose dehydration based on your symptoms and your exam. Blood and urine tests will help confirm the diagnosis. The diagnostic evaluation should also identify the cause of dehydration. °TREATMENT  °Treatment of mild or moderate dehydration can often be done at home by increasing the amount of fluids that you drink. It is best to drink small amounts of fluid more often. Drinking too much at one time can make vomiting worse. Refer to the home care instructions below. °Severe dehydration needs to be treated at the hospital where you will probably be given intravenous (IV) fluids that contain water and electrolytes. °HOME CARE INSTRUCTIONS  °· Ask your caregiver about specific rehydration instructions. °· Drink enough fluids to keep your urine clear or pale yellow. °· Drink small amounts frequently if you have nausea and vomiting. °· Eat as you normally do. °· Avoid: °¨ Foods or drinks high in sugar. °¨ Carbonated  drinks. °¨ Juice. °¨ Extremely hot or cold fluids. °¨ Drinks with caffeine. °¨ Fatty, greasy foods. °¨ Alcohol. °¨ Tobacco. °¨ Overeating. °¨ Gelatin desserts. °· Wash your hands well to avoid spreading bacteria and viruses. °· Only take over-the-counter or prescription medicines for pain, discomfort, or fever as directed by your caregiver. °· Ask your caregiver if you should continue all prescribed and over-the-counter medicines. °· Keep all follow-up appointments with your caregiver. °SEEK MEDICAL CARE IF: °· You have abdominal pain and it increases or stays in one area (localizes). °· You have a rash, stiff neck, or severe headache. °· You are irritable, sleepy, or difficult to awaken. °· You are weak, dizzy, or extremely thirsty. °SEEK IMMEDIATE MEDICAL CARE IF:  °· You are unable to keep fluids down or you get worse despite treatment. °· You have frequent episodes of vomiting or diarrhea. °· You have blood or green matter (bile) in your vomit. °· You have blood in your stool or your stool looks black and tarry. °· You have not urinated in 6 to 8 hours, or you have only urinated a small amount of very dark urine. °· You have a fever. °· You faint. °MAKE SURE YOU:  °· Understand these instructions. °· Will watch your condition. °· Will get help right away if you are not doing well or get worse. °Document Released: 07/18/2005 Document Revised: 10/10/2011 Document Reviewed: 03/07/2011 °ExitCare® Patient Information ©2015 [employer]. This information is not intended to replace advice given to you by your health care provider. Make sure you discuss any questions you have with your health care   provider. ° °Urinary Tract Infection °Urinary tract infections (UTIs) can develop anywhere along your urinary tract. Your urinary tract is your body's drainage system for removing wastes and extra water. Your urinary tract includes two kidneys, two ureters, a bladder, and a urethra. Your kidneys are a pair of bean-shaped  organs. Each kidney is about the size of your fist. They are located below your ribs, one on each side of your spine. °CAUSES °Infections are caused by microbes, which are microscopic organisms, including fungi, viruses, and bacteria. These organisms are so small that they can only be seen through a microscope. Bacteria are the microbes that most commonly cause UTIs. °SYMPTOMS  °Symptoms of UTIs may vary by age and gender of the patient and by the location of the infection. Symptoms in young women typically include a frequent and intense urge to urinate and a painful, burning feeling in the bladder or urethra during urination. Older women and men are more likely to be tired, shaky, and weak and have muscle aches and abdominal pain. A fever may mean the infection is in your kidneys. Other symptoms of a kidney infection include pain in your back or sides below the ribs, nausea, and vomiting. °DIAGNOSIS °To diagnose a UTI, your caregiver will ask you about your symptoms. Your caregiver also will ask to provide a urine sample. The urine sample will be tested for bacteria and white blood cells. White blood cells are made by your body to help fight infection. °TREATMENT  °Typically, UTIs can be treated with medication. Because most UTIs are caused by a bacterial infection, they usually can be treated with the use of antibiotics. The choice of antibiotic and length of treatment depend on your symptoms and the type of bacteria causing your infection. °HOME CARE INSTRUCTIONS °· If you were prescribed antibiotics, take them exactly as your caregiver instructs you. Finish the medication even if you feel better after you have only taken some of the medication. °· Drink enough water and fluids to keep your urine clear or pale yellow. °· Avoid caffeine, tea, and carbonated beverages. They tend to irritate your bladder. °· Empty your bladder often. Avoid holding urine for long periods of time. °· Empty your bladder before and  after sexual intercourse. °· After a bowel movement, women should cleanse from front to back. Use each tissue only once. °SEEK MEDICAL CARE IF:  °· You have back pain. °· You develop a fever. °· Your symptoms do not begin to resolve within 3 days. °SEEK IMMEDIATE MEDICAL CARE IF:  °· You have severe back pain or lower abdominal pain. °· You develop chills. °· You have nausea or vomiting. °· You have continued burning or discomfort with urination. °MAKE SURE YOU:  °· Understand these instructions. °· Will watch your condition. °· Will get help right away if you are not doing well or get worse. °Document Released: 04/27/2005 Document Revised: 01/17/2012 Document Reviewed: 08/26/2011 °ExitCare® Patient Information ©2015 [employer]. This information is not intended to replace advice given to you by your health care provider. Make sure you discuss any questions you have with your health care provider. ° °

## 2014-04-18 ENCOUNTER — Telehealth (HOSPITAL_COMMUNITY): Payer: Self-pay | Admitting: *Deleted

## 2014-04-18 NOTE — Telephone Encounter (Signed)
Preadmission screen  

## 2014-04-21 ENCOUNTER — Inpatient Hospital Stay (HOSPITAL_COMMUNITY)
Admission: RE | Admit: 2014-04-21 | Discharge: 2014-04-25 | DRG: 765 | Disposition: A | Discharge status: Home / Self Care | Payer: Medicaid Other | Source: Ambulatory Visit | Attending: Obstetrics | Admitting: Obstetrics

## 2014-04-21 ENCOUNTER — Encounter (HOSPITAL_COMMUNITY): Payer: Medicaid Other | Admitting: Anesthesiology

## 2014-04-21 ENCOUNTER — Inpatient Hospital Stay (HOSPITAL_COMMUNITY): Payer: Medicaid Other

## 2014-04-21 ENCOUNTER — Encounter (HOSPITAL_COMMUNITY): Payer: Self-pay

## 2014-04-21 ENCOUNTER — Inpatient Hospital Stay (HOSPITAL_COMMUNITY): Payer: Medicaid Other | Admitting: Anesthesiology

## 2014-04-21 VITALS — BP 112/65 | HR 64 | Temp 98.3°F | Resp 17 | Ht 65.0 in | Wt 204.0 lb

## 2014-04-21 DIAGNOSIS — O34219 Maternal care for unspecified type scar from previous cesarean delivery: Secondary | ICD-10-CM | POA: Diagnosis present

## 2014-04-21 DIAGNOSIS — Z98891 History of uterine scar from previous surgery: Secondary | ICD-10-CM

## 2014-04-21 DIAGNOSIS — Z1389 Encounter for screening for other disorder: Secondary | ICD-10-CM

## 2014-04-21 DIAGNOSIS — O480 Post-term pregnancy: Secondary | ICD-10-CM

## 2014-04-21 DIAGNOSIS — O411090 Infection of amniotic sac and membranes, unspecified, unspecified trimester, not applicable or unspecified: Secondary | ICD-10-CM | POA: Diagnosis present

## 2014-04-21 LAB — CBC
HCT: 33 % — ABNORMAL LOW (ref 36.0–46.0)
Hemoglobin: 10.5 g/dL — ABNORMAL LOW (ref 12.0–15.0)
MCH: 25 pg — ABNORMAL LOW (ref 26.0–34.0)
MCHC: 31.8 g/dL (ref 30.0–36.0)
MCV: 78.6 fL (ref 78.0–100.0)
Platelets: 153 10*3/uL (ref 150–400)
RBC: 4.2 MIL/uL (ref 3.87–5.11)
RDW: 17 % — ABNORMAL HIGH (ref 11.5–15.5)
WBC: 5.7 10*3/uL (ref 4.0–10.5)

## 2014-04-21 LAB — OB RESULTS CONSOLE ANTIBODY SCREEN: Antibody Screen: NEGATIVE

## 2014-04-21 LAB — OB RESULTS CONSOLE GC/CHLAMYDIA
Chlamydia: NEGATIVE
Gonorrhea: NEGATIVE

## 2014-04-21 LAB — TYPE AND SCREEN
ABO/RH(D): O POS
Antibody Screen: NEGATIVE

## 2014-04-21 LAB — RPR TITER: RPR Titer: 1:4 {titer} — AB

## 2014-04-21 LAB — OB RESULTS CONSOLE ABO/RH: RH Type: POSITIVE

## 2014-04-21 LAB — OB RESULTS CONSOLE HEPATITIS B SURFACE ANTIGEN: Hepatitis B Surface Ag: NEGATIVE

## 2014-04-21 LAB — RPR: RPR Ser Ql: REACTIVE — AB

## 2014-04-21 LAB — OB RESULTS CONSOLE RUBELLA ANTIBODY, IGM: Rubella: IMMUNE

## 2014-04-21 LAB — OB RESULTS CONSOLE GBS: GBS: NEGATIVE

## 2014-04-21 LAB — OB RESULTS CONSOLE HIV ANTIBODY (ROUTINE TESTING): HIV: NONREACTIVE

## 2014-04-21 LAB — ABO/RH: ABO/RH(D): O POS

## 2014-04-21 LAB — OB RESULTS CONSOLE RPR: RPR: NONREACTIVE

## 2014-04-21 MED ORDER — EPHEDRINE 5 MG/ML INJ: 10.0000 mg | INTRAVENOUS | Status: DC | PRN

## 2014-04-21 MED ORDER — VALACYCLOVIR HCL 500 MG PO TABS
500.0000 mg | ORAL_TABLET | Freq: Every day | ORAL | Status: DC
  Administered 2014-04-21: 500 mg via ORAL
  Filled 2014-04-21 (×2): qty 1

## 2014-04-21 MED ORDER — CITRIC ACID-SODIUM CITRATE 334-500 MG/5ML PO SOLN
30.0000 mL | ORAL | Status: DC | PRN
  Administered 2014-04-22: 30 mL via ORAL
  Filled 2014-04-21: qty 15

## 2014-04-21 MED ORDER — ONDANSETRON HCL 4 MG/2ML IJ SOLN: 4.0000 mg | Freq: Four times a day (QID) | INTRAMUSCULAR | Status: DC | PRN

## 2014-04-21 MED ORDER — ACETAMINOPHEN 325 MG PO TABS
650.0000 mg | ORAL_TABLET | ORAL | Status: DC | PRN
  Administered 2014-04-22 (×2): 650 mg via ORAL
  Filled 2014-04-21 (×2): qty 2

## 2014-04-21 MED ORDER — OXYCODONE-ACETAMINOPHEN 5-325 MG PO TABS: 2.0000 | ORAL_TABLET | ORAL | Status: DC | PRN

## 2014-04-21 MED ORDER — LACTATED RINGERS IV SOLN
500.0000 mL | INTRAVENOUS | Status: DC | PRN
  Administered 2014-04-22 (×3): 500 mL via INTRAVENOUS

## 2014-04-21 MED ORDER — OXYTOCIN BOLUS FROM INFUSION: 500.0000 mL | INTRAVENOUS | Status: DC

## 2014-04-21 MED ORDER — LACTATED RINGERS IV SOLN
INTRAVENOUS | Status: DC
  Administered 2014-04-21 – 2014-04-22 (×4): via INTRAVENOUS

## 2014-04-21 MED ORDER — PHENYLEPHRINE 40 MCG/ML (10ML) SYRINGE FOR IV PUSH (FOR BLOOD PRESSURE SUPPORT)
80.0000 ug | PREFILLED_SYRINGE | INTRAVENOUS | Status: DC | PRN
  Filled 2014-04-21: qty 10

## 2014-04-21 MED ORDER — DIPHENHYDRAMINE HCL 50 MG/ML IJ SOLN: 12.5000 mg | INTRAMUSCULAR | Status: DC | PRN

## 2014-04-21 MED ORDER — LIDOCAINE HCL (PF) 1 % IJ SOLN
INTRAMUSCULAR | Status: DC | PRN
  Administered 2014-04-21 (×2): 5 mL

## 2014-04-21 MED ORDER — LIDOCAINE HCL (PF) 1 % IJ SOLN
30.0000 mL | INTRAMUSCULAR | Status: DC | PRN
  Filled 2014-04-21: qty 30

## 2014-04-21 MED ORDER — FLEET ENEMA 7-19 GM/118ML RE ENEM: 1.0000 | ENEMA | RECTAL | Status: DC | PRN

## 2014-04-21 MED ORDER — BUTORPHANOL TARTRATE 1 MG/ML IJ SOLN
1.0000 mg | INTRAMUSCULAR | Status: DC | PRN
  Administered 2014-04-21 (×2): 1 mg via INTRAVENOUS
  Filled 2014-04-21 (×3): qty 1

## 2014-04-21 MED ORDER — OXYTOCIN 40 UNITS IN LACTATED RINGERS INFUSION - SIMPLE MED
62.5000 mL/h | INTRAVENOUS | Status: DC
  Filled 2014-04-21: qty 1000

## 2014-04-21 MED ORDER — OXYTOCIN 40 UNITS IN LACTATED RINGERS INFUSION - SIMPLE MED
1.0000 m[IU]/min | INTRAVENOUS | Status: DC
  Administered 2014-04-21: 2 m[IU]/min via INTRAVENOUS

## 2014-04-21 MED ORDER — OXYCODONE-ACETAMINOPHEN 5-325 MG PO TABS: 1.0000 | ORAL_TABLET | ORAL | Status: DC | PRN

## 2014-04-21 MED ORDER — LACTATED RINGERS IV SOLN
500.0000 mL | Freq: Once | INTRAVENOUS | Status: AC
  Administered 2014-04-21: 500 mL via INTRAVENOUS

## 2014-04-21 MED ORDER — FENTANYL 2.5 MCG/ML BUPIVACAINE 1/10 % EPIDURAL INFUSION (WH - ANES)
14.0000 mL/h | INTRAMUSCULAR | Status: DC | PRN
  Administered 2014-04-21 – 2014-04-22 (×2): 14 mL/h via EPIDURAL
  Filled 2014-04-21 (×2): qty 125

## 2014-04-21 MED ORDER — PHENYLEPHRINE 40 MCG/ML (10ML) SYRINGE FOR IV PUSH (FOR BLOOD PRESSURE SUPPORT): 80.0000 ug | PREFILLED_SYRINGE | INTRAVENOUS | Status: DC | PRN

## 2014-04-21 MED ORDER — TERBUTALINE SULFATE 1 MG/ML IJ SOLN: 0.2500 mg | Freq: Once | INTRAMUSCULAR | Status: AC | PRN

## 2014-04-21 NOTE — Anesthesia Preprocedure Evaluation (Addendum)
Anesthesia Evaluation  Patient identified by MRN, date of birth, ID band Patient awake    Reviewed: Allergy & Precautions, H&P , Patient's Chart, lab work & pertinent test results  Airway Mallampati: II TM Distance: >3 FB Neck ROM: full    Dental   Pulmonary  breath sounds clear to auscultation        Cardiovascular Rhythm:regular Rate:Normal     Neuro/Psych    GI/Hepatic   Endo/Other    Renal/GU      Musculoskeletal   Abdominal   Peds  Hematology   Anesthesia Other Findings   Reproductive/Obstetrics (+) Pregnancy                           Anesthesia Physical Anesthesia Plan  ASA: II  Anesthesia Plan: Epidural   Post-op Pain Management:    Induction:   Airway Management Planned:   Additional Equipment:   Intra-op Plan:   Post-operative Plan:   Informed Consent: I have reviewed the patients History and Physical, chart, labs and discussed the procedure including the risks, benefits and alternatives for the proposed anesthesia with the patient or authorized representative who has indicated his/her understanding and acceptance.     Plan Discussed with:   Anesthesia Plan Comments:        vback  Anesthesia Quick Evaluation

## 2014-04-21 NOTE — Anesthesia Procedure Notes (Signed)
Epidural Patient location during procedure: OB Start time: 04/21/2014 10:18 PM  Staffing Anesthesiologist: Rudean Curt Performed by: anesthesiologist   Preanesthetic Checklist Completed: patient identified, site marked, surgical consent, pre-op evaluation, timeout performed, IV checked, risks and benefits discussed and monitors and equipment checked  Epidural Patient position: sitting Prep: site prepped and draped and DuraPrep Patient monitoring: continuous pulse ox and blood pressure Approach: midline Location: L3-L4 Injection technique: LOR air  Needle:  Needle type: Tuohy  Needle gauge: 17 G Needle length: 9 cm and 9 Needle insertion depth: 7 cm Catheter type: closed end flexible Catheter size: 19 Gauge Catheter at skin depth: 12 cm Test dose: negative  Assessment Events: blood not aspirated, injection not painful, no injection resistance, negative IV test and no paresthesia  Additional Notes Patient identified.  Risk benefits discussed including failed block, incomplete pain control, headache, nerve damage, paralysis, blood pressure changes, nausea, vomiting, reactions to medication both toxic or allergic, and postpartum back pain.  Patient expressed understanding and wished to proceed.  All questions were answered.  Sterile technique used throughout procedure and epidural site dressed with sterile barrier dressing. No paresthesia or other complications noted.The patient did not experience any signs of intravascular injection such as tinnitus or metallic taste in mouth nor signs of intrathecal spread such as rapid motor block. Please see nursing notes for vital signs.

## 2014-04-21 NOTE — H&P (Signed)
This is Dr. Gracy Racer dictating the history and physical on  Cheryl Stewart  she's a 28 year old gravida  2 p1001 at 39+ weeks negative GBS brought in for induction she had a previous C-section cervix 1-2 cm 90% vertex -3 amniotomy performed the fluids clear and IUPC inserted she's on low-dose Pitocin low-dose Pitocin Past surgical history C-section past Past medical history during this pregnancy was found to have positive  RPR and positive MHA-TP and was treated for syphilis Social history negative Physical exam well-developed female in no distress HEENT negative Lungs clear to P&A  Heart regular rhythm no murmurs no gallops Breasts negative Abdomen term Pelvic as described above Extremities negative and

## 2014-04-22 ENCOUNTER — Encounter (HOSPITAL_COMMUNITY): Payer: Self-pay

## 2014-04-22 ENCOUNTER — Encounter (HOSPITAL_COMMUNITY)
Admission: RE | Disposition: A | Discharge status: Home / Self Care | Payer: Self-pay | Source: Ambulatory Visit | Attending: Obstetrics

## 2014-04-22 DIAGNOSIS — Z98891 History of uterine scar from previous surgery: Secondary | ICD-10-CM

## 2014-04-22 SURGERY — Surgical Case
Anesthesia: Epidural

## 2014-04-22 MED ORDER — LANOLIN HYDROUS EX OINT: 1.0000 "application " | TOPICAL_OINTMENT | CUTANEOUS | Status: DC | PRN

## 2014-04-22 MED ORDER — SODIUM CHLORIDE 0.9 % IV SOLN
2.0000 g | Freq: Four times a day (QID) | INTRAVENOUS | Status: DC
  Administered 2014-04-22: 2 g via INTRAVENOUS
  Filled 2014-04-22 (×3): qty 2000

## 2014-04-22 MED ORDER — SIMETHICONE 80 MG PO CHEW
80.0000 mg | CHEWABLE_TABLET | ORAL | Status: DC
  Administered 2014-04-22 – 2014-04-25 (×3): 80 mg via ORAL
  Filled 2014-04-22 (×3): qty 1

## 2014-04-22 MED ORDER — WITCH HAZEL-GLYCERIN EX PADS: 1.0000 "application " | MEDICATED_PAD | CUTANEOUS | Status: DC | PRN

## 2014-04-22 MED ORDER — OXYCODONE-ACETAMINOPHEN 5-325 MG PO TABS
2.0000 | ORAL_TABLET | ORAL | Status: DC | PRN
  Administered 2014-04-24 – 2014-04-25 (×3): 2 via ORAL
  Filled 2014-04-22 (×2): qty 2

## 2014-04-22 MED ORDER — SIMETHICONE 80 MG PO CHEW
80.0000 mg | CHEWABLE_TABLET | Freq: Three times a day (TID) | ORAL | Status: DC
  Administered 2014-04-22 – 2014-04-25 (×6): 80 mg via ORAL
  Filled 2014-04-22 (×7): qty 1

## 2014-04-22 MED ORDER — TETANUS-DIPHTH-ACELL PERTUSSIS 5-2.5-18.5 LF-MCG/0.5 IM SUSP
0.5000 mL | Freq: Once | INTRAMUSCULAR | Status: AC
  Administered 2014-04-23: 0.5 mL via INTRAMUSCULAR
  Filled 2014-04-22: qty 0.5

## 2014-04-22 MED ORDER — MORPHINE SULFATE (PF) 0.5 MG/ML IJ SOLN
INTRAMUSCULAR | Status: DC | PRN
  Administered 2014-04-22: 3 mg via EPIDURAL

## 2014-04-22 MED ORDER — DIPHENHYDRAMINE HCL 25 MG PO CAPS
25.0000 mg | ORAL_CAPSULE | Freq: Four times a day (QID) | ORAL | Status: DC | PRN
  Filled 2014-04-22: qty 1

## 2014-04-22 MED ORDER — SIMETHICONE 80 MG PO CHEW: 80.0000 mg | CHEWABLE_TABLET | ORAL | Status: DC | PRN

## 2014-04-22 MED ORDER — CEFAZOLIN SODIUM-DEXTROSE 2-3 GM-% IV SOLR
INTRAVENOUS | Status: DC | PRN
  Administered 2014-04-22: 2 g via INTRAVENOUS

## 2014-04-22 MED ORDER — ONDANSETRON HCL 4 MG/2ML IJ SOLN: 4.0000 mg | INTRAMUSCULAR | Status: DC | PRN

## 2014-04-22 MED ORDER — MENTHOL 3 MG MT LOZG: 1.0000 | LOZENGE | OROMUCOSAL | Status: DC | PRN

## 2014-04-22 MED ORDER — SCOPOLAMINE 1 MG/3DAYS TD PT72
1.0000 | MEDICATED_PATCH | Freq: Once | TRANSDERMAL | Status: AC
  Administered 2014-04-22: 1.5 mg via TRANSDERMAL

## 2014-04-22 MED ORDER — ONDANSETRON HCL 4 MG/2ML IJ SOLN
INTRAMUSCULAR | Status: AC
  Filled 2014-04-22: qty 2

## 2014-04-22 MED ORDER — DIPHENHYDRAMINE HCL 50 MG/ML IJ SOLN: 12.5000 mg | INTRAMUSCULAR | Status: DC | PRN

## 2014-04-22 MED ORDER — ZOLPIDEM TARTRATE 5 MG PO TABS
5.0000 mg | ORAL_TABLET | Freq: Every evening | ORAL | Status: DC | PRN
Start: 2014-04-22 — End: 2014-04-25

## 2014-04-22 MED ORDER — SODIUM CHLORIDE 0.9 % IJ SOLN: 3.0000 mL | INTRAMUSCULAR | Status: DC | PRN

## 2014-04-22 MED ORDER — MEPERIDINE HCL 25 MG/ML IJ SOLN: 6.2500 mg | INTRAMUSCULAR | Status: DC | PRN

## 2014-04-22 MED ORDER — LACTATED RINGERS IV SOLN: INTRAVENOUS | Status: DC

## 2014-04-22 MED ORDER — SCOPOLAMINE 1 MG/3DAYS TD PT72
MEDICATED_PATCH | TRANSDERMAL | Status: AC
  Filled 2014-04-22: qty 1

## 2014-04-22 MED ORDER — ONDANSETRON HCL 4 MG/2ML IJ SOLN: 4.0000 mg | Freq: Three times a day (TID) | INTRAMUSCULAR | Status: DC | PRN

## 2014-04-22 MED ORDER — GENTAMICIN SULFATE 40 MG/ML IJ SOLN
180.0000 mg | Freq: Once | INTRAVENOUS | Status: AC
  Administered 2014-04-22: 180 mg via INTRAVENOUS
  Filled 2014-04-22: qty 4.5

## 2014-04-22 MED ORDER — SENNOSIDES-DOCUSATE SODIUM 8.6-50 MG PO TABS
2.0000 | ORAL_TABLET | ORAL | Status: DC
  Administered 2014-04-22 – 2014-04-25 (×3): 2 via ORAL
  Filled 2014-04-22 (×3): qty 2

## 2014-04-22 MED ORDER — OXYTOCIN 10 UNIT/ML IJ SOLN
40.0000 [IU] | INTRAVENOUS | Status: DC | PRN
  Administered 2014-04-22: 40 [IU] via INTRAVENOUS

## 2014-04-22 MED ORDER — IBUPROFEN 600 MG PO TABS
600.0000 mg | ORAL_TABLET | Freq: Four times a day (QID) | ORAL | Status: DC
  Administered 2014-04-22 – 2014-04-25 (×11): 600 mg via ORAL
  Filled 2014-04-22 (×12): qty 1

## 2014-04-22 MED ORDER — DIPHENHYDRAMINE HCL 25 MG PO CAPS
25.0000 mg | ORAL_CAPSULE | ORAL | Status: DC | PRN
  Administered 2014-04-22 – 2014-04-23 (×2): 25 mg via ORAL
  Filled 2014-04-22: qty 1

## 2014-04-22 MED ORDER — SODIUM BICARBONATE 8.4 % IV SOLN
INTRAVENOUS | Status: DC | PRN
  Administered 2014-04-22 (×4): 5 mL via EPIDURAL

## 2014-04-22 MED ORDER — DIBUCAINE 1 % RE OINT: 1.0000 "application " | TOPICAL_OINTMENT | RECTAL | Status: DC | PRN

## 2014-04-22 MED ORDER — PRENATAL MULTIVITAMIN CH
1.0000 | ORAL_TABLET | Freq: Every day | ORAL | Status: DC
  Administered 2014-04-23 – 2014-04-25 (×3): 1 via ORAL
  Filled 2014-04-22 (×3): qty 1

## 2014-04-22 MED ORDER — ONDANSETRON HCL 4 MG PO TABS: 4.0000 mg | ORAL_TABLET | ORAL | Status: DC | PRN

## 2014-04-22 MED ORDER — KETOROLAC TROMETHAMINE 30 MG/ML IJ SOLN: 30.0000 mg | Freq: Four times a day (QID) | INTRAMUSCULAR | Status: AC | PRN

## 2014-04-22 MED ORDER — NALOXONE HCL 1 MG/ML IJ SOLN
1.0000 ug/kg/h | INTRAVENOUS | Status: DC | PRN
  Filled 2014-04-22: qty 2

## 2014-04-22 MED ORDER — NALBUPHINE HCL 10 MG/ML IJ SOLN: 5.0000 mg | Freq: Once | INTRAMUSCULAR | Status: AC | PRN

## 2014-04-22 MED ORDER — NALBUPHINE HCL 10 MG/ML IJ SOLN: 5.0000 mg | INTRAMUSCULAR | Status: DC | PRN

## 2014-04-22 MED ORDER — FENTANYL CITRATE 0.05 MG/ML IJ SOLN: 25.0000 ug | INTRAMUSCULAR | Status: DC | PRN

## 2014-04-22 MED ORDER — KETOROLAC TROMETHAMINE 30 MG/ML IJ SOLN
30.0000 mg | Freq: Four times a day (QID) | INTRAMUSCULAR | Status: AC | PRN
  Administered 2014-04-22: 30 mg via INTRAVENOUS

## 2014-04-22 MED ORDER — OXYTOCIN 40 UNITS IN LACTATED RINGERS INFUSION - SIMPLE MED: 62.5000 mL/h | INTRAVENOUS | Status: AC

## 2014-04-22 MED ORDER — NALBUPHINE HCL 10 MG/ML IJ SOLN
5.0000 mg | Freq: Once | INTRAMUSCULAR | Status: AC | PRN
  Administered 2014-04-22: 5 mg via SUBCUTANEOUS
  Filled 2014-04-22: qty 1

## 2014-04-22 MED ORDER — MORPHINE SULFATE 0.5 MG/ML IJ SOLN
INTRAMUSCULAR | Status: AC
  Filled 2014-04-22: qty 10

## 2014-04-22 MED ORDER — CEFAZOLIN SODIUM-DEXTROSE 2-3 GM-% IV SOLR
INTRAVENOUS | Status: AC
  Filled 2014-04-22: qty 50

## 2014-04-22 MED ORDER — IBUPROFEN 600 MG PO TABS: 600.0000 mg | ORAL_TABLET | Freq: Four times a day (QID) | ORAL | Status: DC

## 2014-04-22 MED ORDER — GENTAMICIN SULFATE 40 MG/ML IJ SOLN
170.0000 mg | Freq: Three times a day (TID) | INTRAVENOUS | Status: DC
  Filled 2014-04-22: qty 4.25

## 2014-04-22 MED ORDER — KETOROLAC TROMETHAMINE 30 MG/ML IJ SOLN
INTRAMUSCULAR | Status: AC
  Administered 2014-04-22: 30 mg via INTRAVENOUS
  Filled 2014-04-22: qty 1

## 2014-04-22 MED ORDER — OXYTOCIN 40 UNITS IN LACTATED RINGERS INFUSION - SIMPLE MED: 1.0000 m[IU]/min | INTRAVENOUS | Status: DC

## 2014-04-22 MED ORDER — OXYTOCIN 10 UNIT/ML IJ SOLN
INTRAMUSCULAR | Status: AC
  Filled 2014-04-22: qty 4

## 2014-04-22 MED ORDER — OXYCODONE-ACETAMINOPHEN 5-325 MG PO TABS
1.0000 | ORAL_TABLET | ORAL | Status: DC | PRN
  Administered 2014-04-23: 1 via ORAL
  Filled 2014-04-22 (×4): qty 1

## 2014-04-22 MED ORDER — ONDANSETRON HCL 4 MG/2ML IJ SOLN
INTRAMUSCULAR | Status: DC | PRN
  Administered 2014-04-22: 4 mg via INTRAVENOUS

## 2014-04-22 MED ORDER — NALOXONE HCL 0.4 MG/ML IJ SOLN: 0.4000 mg | INTRAMUSCULAR | Status: DC | PRN

## 2014-04-22 SURGICAL SUPPLY — 39 items
ADH SKN CLS APL DERMABOND .7 (GAUZE/BANDAGES/DRESSINGS) ×1
BLADE SURG 10 STRL SS (BLADE) ×4 IMPLANT
CLAMP CORD UMBIL (MISCELLANEOUS) IMPLANT
CLOTH BEACON ORANGE TIMEOUT ST (SAFETY) ×2 IMPLANT
DERMABOND ADVANCED (GAUZE/BANDAGES/DRESSINGS) ×1
DERMABOND ADVANCED .7 DNX12 (GAUZE/BANDAGES/DRESSINGS) ×1 IMPLANT
DRAPE SHEET LG 3/4 BI-LAMINATE (DRAPES) IMPLANT
DRESSING DISP NPWT PICO 4X12 (MISCELLANEOUS) ×1 IMPLANT
DRSG OPSITE POSTOP 4X10 (GAUZE/BANDAGES/DRESSINGS) ×2 IMPLANT
DURAPREP 26ML APPLICATOR (WOUND CARE) ×2 IMPLANT
ELECT REM PT RETURN 9FT ADLT (ELECTROSURGICAL) ×2
ELECTRODE REM PT RTRN 9FT ADLT (ELECTROSURGICAL) ×1 IMPLANT
EXTRACTOR VACUUM M CUP 4 TUBE (SUCTIONS) IMPLANT
GLOVE BIO SURGEON STRL SZ8.5 (GLOVE) ×2 IMPLANT
GOWN STRL REUS W/TWL 2XL LVL3 (GOWN DISPOSABLE) ×2 IMPLANT
GOWN STRL REUS W/TWL LRG LVL3 (GOWN DISPOSABLE) ×2 IMPLANT
KIT ABG SYR 3ML LUER SLIP (SYRINGE) IMPLANT
NDL HYPO 25X5/8 SAFETYGLIDE (NEEDLE) ×1 IMPLANT
NEEDLE HYPO 25X5/8 SAFETYGLIDE (NEEDLE) ×2 IMPLANT
NS IRRIG 1000ML POUR BTL (IV SOLUTION) ×2 IMPLANT
PACK C SECTION WH (CUSTOM PROCEDURE TRAY) ×2 IMPLANT
PAD OB MATERNITY 4.3X12.25 (PERSONAL CARE ITEMS) ×2 IMPLANT
RETRACTOR WND ALEXIS 25 LRG (MISCELLANEOUS) IMPLANT
RTRCTR WOUND ALEXIS 25CM LRG (MISCELLANEOUS) ×2
STAPLER VISISTAT 35W (STAPLE) IMPLANT
SUT CHROMIC 0 CT 802H (SUTURE) ×2 IMPLANT
SUT CHROMIC 0 MO4 CR (SUTURE) IMPLANT
SUT CHROMIC 1 CTX 36 (SUTURE) ×4 IMPLANT
SUT CHROMIC 2 0 SH (SUTURE) ×2 IMPLANT
SUT GUT PLAIN 0 CT-3 TAN 27 (SUTURE) IMPLANT
SUT MON AB 4-0 PS1 27 (SUTURE) ×2 IMPLANT
SUT PDS AB 0 CTX 36 PDP370T (SUTURE) IMPLANT
SUT VIC AB 0 CT1 18XCR BRD8 (SUTURE) IMPLANT
SUT VIC AB 0 CT1 8-18 (SUTURE)
SUT VIC AB 0 CTX 36 (SUTURE) ×4
SUT VIC AB 0 CTX36XBRD ANBCTRL (SUTURE) ×2 IMPLANT
TOWEL OR 17X24 6PK STRL BLUE (TOWEL DISPOSABLE) ×2 IMPLANT
TRAY FOLEY CATH 14FR (SET/KITS/TRAYS/PACK) ×2 IMPLANT
WATER STERILE IRR 1000ML POUR (IV SOLUTION) ×2 IMPLANT

## 2014-04-22 NOTE — Progress Notes (Signed)
Patient ID: Cheryl Stewart, female   DOB: 11-24-1985, 29 y.o.   MRN: [id number]  At 5 AM patient was fully dilated molded to plus one station her temp is  101.5 she received ampicillin 2 g IV and gentamicin IV prior to this time and also Tylenol fetal heart 155 reactive and to be allowed to labor down

## 2014-04-22 NOTE — Progress Notes (Signed)
ANTIBIOTIC CONSULT NOTE - INITIAL  Pharmacy Consult for Gentamicin Indication: Maternal temperature/Chorioamnionitis  No Known Allergies  Patient Measurements: Height: 5\' 5"  (165.1 cm) Weight: 204 lb (92.534 kg) IBW/kg (Calculated) : 57 Adjusted Body Weight: 67.7 kg  Vital Signs: Temp: 99.6 F (37.6 C) (09/22 0300) Temp src: Oral (09/22 0300) BP: 115/55 mmHg (09/22 0300) Pulse Rate: 105 (09/22 0300) Intake/Output from previous day: 09/21 0701 - 09/22 0700 In: -  Out: 2700 [Urine:2700] Intake/Output from this shift: Total I/O In: -  Out: 2700 [Urine:2700]  Labs:  Recent Labs  04/21/14 0820  WBC 5.7  HGB 10.5*  PLT 153   Estimated Creatinine Clearance: 117.7 ml/min (by C-G formula based on Cr of 0.75). No results found for this basename: VANCOTROUGH, Corlis Leak, VANCORANDOM, GENTTROUGH, GENTPEAK, GENTRANDOM, TOBRATROUGH, TOBRAPEAK, TOBRARND, AMIKACINPEAK, AMIKACINTROU, AMIKACIN,  in the last 72 hours   Microbiology: Recent Results (from the past 720 hour(s))  OB RESULTS CONSOLE GBS     Status: None   Collection Time    04/21/14 12:00 AM      Result Value Ref Range Status   GBS Negative   Final    Medical History: Past Medical History  Diagnosis Date  . LGSIL (low grade squamous intraepithelial dysplasia) 07/12    Medications:  Ampicillin 2 Gm IV every 6 hours  Assessment: 28 yo G2P1001 at 39 weeks admitted for IOL. Pt now in labor with increased temperature and presumed chorioamnionitis.  Goal of Therapy:  Gentamicin peaks 6-8 mcg/ml; troughs <1 mcg/ml  Plan:  Gentamicin loading dose 180 mg IV; then 170 mg IV every 8 hours Monitor serum creatinine if gentamicin is continued post partum. Serum gentamicin levels as indicated.  [name] 04/22/2014,3:56 AM

## 2014-04-22 NOTE — Transfer of Care (Signed)
Immediate Anesthesia Transfer of Care Note  Patient: Cheryl Stewart  Procedure(s) Performed: Procedure(s): CESAREAN SECTION (N/A)  Patient Location: PACU  Anesthesia Type:Epidural  Level of Consciousness: awake, alert , oriented and patient cooperative  Airway & Oxygen Therapy: Patient Spontanous Breathing  Post-op Assessment: Report given to PACU RN and Post -op Vital signs reviewed and stable  Post vital signs: Reviewed and stable  Complications: No apparent anesthesia complications

## 2014-04-22 NOTE — Lactation Note (Signed)
This note was copied from the chart of [place]. Lactation Consultation Note Initial visit done.  Breastfeeding consultation services and support information reviewed and given to mom.  She states baby latched and nursed well for 45 minutes a few hours ago.  She just recently attempted but baby too sleepy.  Reviewed normal newborn behavior.  Instructed on what feeding cues look like and to feed with any cue.  Encouraged to call for assist/concerns prn.  Patient Name: Cheryl Stewart [id number] Date: 04/22/2014 Reason for consult: Initial assessment   Maternal Data    Feeding Feeding Type: Breast Fed Length of feed: 0 min  LATCH Score/Interventions                      Lactation Tools Discussed/Used     Consult Status Consult Status: Follow-up Date: 04/23/14 Follow-up type: In-patient    Ave Filter 04/22/2014, 5:41 PM

## 2014-04-22 NOTE — Progress Notes (Signed)
Patient ID: Cheryl Stewart, female   DOB: 08/10/1985, 28 y.o.   MRN: [id number] When she pushes she has late decelerations and is not  Bringing the head down  she'll be delivered by repeat C-section

## 2014-04-22 NOTE — Consult Note (Signed)
Neonatology Note:   Attendance at C-section:    I was asked by Dr. Ruthann Cancer to attend this repeat C/S at term due to [facility] after TOLAC. The mother is a G4P1A2 O pos, GBS neg with pregnancy complicated by treatment for syphilis in July. ROM 21 hours prior to delivery, fluid clear. Mother had fever to 101.6 during labor and was treated with Ampicillin and Gentamicin > 4 hours before delivery.  Infant delivered OP and was vigorous with good spontaneous cry and tone. Needed no suctioning. Ap 9/9. Lungs clear to ausc in DR. To CN to care of Pediatrician.   Real Cons, MD

## 2014-04-22 NOTE — Anesthesia Postprocedure Evaluation (Signed)
  Anesthesia Post-op Note  Patient: Cheryl Stewart  Procedure(s) Performed: Procedure(s): CESAREAN SECTION (N/A)  Patient Location: PACU and Mother/Baby  Anesthesia Type:Epidural  Level of Consciousness: awake, alert  and oriented  Airway and Oxygen Therapy: Patient Spontanous Breathing  Post-op Pain: mild  Post-op Assessment: Post-op Vital signs reviewed  Post-op Vital Signs: Reviewed and stable  Last Vitals:  Filed Vitals:   04/22/14 1110  BP: 109/57  Pulse: 91  Temp: 37.5 C  Resp: 20    Complications: No apparent anesthesia complications

## 2014-04-22 NOTE — Op Note (Signed)
Preop diagnosis nonreassuring fetal heart rate tracing and failure to progress Postop diagnosis is same Surgeon Dr. Gracy Racer First assistant Dr. Juanda Crumble Anesthesia epidural Procedure patient placed on the operating table in the supine position abdomen prepped and draped bladder emptied with a Foley catheter transverse suprapubic incision made through the old scar carried down to the rectus fascia fascia cleaned and incised the length of the incision recti muscles retracted laterally peritoneum incised longitudinally transverse incision made on the visceroperitoneum above the bladder and the bladder mobilized inferiorly transverse low uterine incision made patient delivered from the OP position of a female Apgar 9 and 9 placenta was posterior removed manually uterine cavity clean with dry laps uterine incision closed in one layer with continuous   one chromic bladder flap reattached to a chromic uterus well contracted tubes and ovaries normal abdomen closed in layers peritoneum continuous with of 0 chromic fascia continuous with of 0 Dexon and the skin shows a subcuticular stitch of 4-0 Monocryl blood loss 700 cc patient tolerated the procedure well

## 2014-04-22 NOTE — Anesthesia Postprocedure Evaluation (Signed)
  Anesthesia Post-op Note  Patient: Cheryl Stewart  Procedure(s) Performed: Procedure(s): CESAREAN SECTION (N/A)  Patient Location: PACU  Anesthesia Type:Epidural  Level of Consciousness: awake, alert  and oriented  Airway and Oxygen Therapy: Patient Spontanous Breathing  Post-op Pain: none  Post-op Assessment: Post-op Vital signs reviewed, Patient's Cardiovascular Status Stable, Respiratory Function Stable, Patent Airway, No signs of Nausea or vomiting, Pain level controlled, No headache, No backache, No residual numbness and No residual motor weakness  Post-op Vital Signs: Reviewed and stable  Last Vitals:  Filed Vitals:   04/22/14 1015  BP: 121/66  Pulse: 97  Temp:   Resp: 25    Complications: No apparent anesthesia complications

## 2014-04-22 NOTE — Progress Notes (Signed)
Dr Ruthann Cancer at bedside discussing risks and benefits of CS with pt. Pt verbalizes understanding and has no further questions.

## 2014-04-23 ENCOUNTER — Encounter (HOSPITAL_COMMUNITY): Payer: Self-pay | Admitting: Obstetrics

## 2014-04-23 LAB — CBC
HCT: 27.4 % — ABNORMAL LOW (ref 36.0–46.0)
Hemoglobin: 8.7 g/dL — ABNORMAL LOW (ref 12.0–15.0)
MCH: 24.9 pg — ABNORMAL LOW (ref 26.0–34.0)
MCHC: 31.8 g/dL (ref 30.0–36.0)
MCV: 78.3 fL (ref 78.0–100.0)
Platelets: 132 10*3/uL — ABNORMAL LOW (ref 150–400)
RBC: 3.5 MIL/uL — ABNORMAL LOW (ref 3.87–5.11)
RDW: 17.3 % — ABNORMAL HIGH (ref 11.5–15.5)
WBC: 14.3 10*3/uL — ABNORMAL HIGH (ref 4.0–10.5)

## 2014-04-23 LAB — FLUORESCENT TREPONEMAL AB(FTA)-IGG-BLD: Fluorescent Treponemal Ab, IgG: REACTIVE — AB

## 2014-04-23 NOTE — Progress Notes (Signed)
Patient ID: Cheryl Stewart, female   DOB: 13-Jun-1986, 28 y.o.   MRN: [id number] Postop day 1 Vital signs normal Fundus firm Lochia moderate Legs negative doing well

## 2014-04-23 NOTE — Anesthesia Postprocedure Evaluation (Signed)
  Anesthesia Post-op Note  Anesthesia Post Note  Patient: Cheryl Stewart  Procedure(s) Performed: Procedure(s) (LRB): CESAREAN SECTION (N/A)  Anesthesia type: Epidural  Patient location: Mother/Baby  Post pain: Pain level controlled  Post assessment: Post-op Vital signs reviewed  Last Vitals:  Filed Vitals:   04/23/14 0550  BP: 105/58  Pulse: 92  Temp: 37.1 C  Resp: 16    Post vital signs: Reviewed  Level of consciousness:alert  Complications: No apparent anesthesia complications

## 2014-04-24 NOTE — Progress Notes (Signed)
Ur chart review completed.  

## 2014-04-24 NOTE — Lactation Note (Signed)
This note was copied from the chart of [place]. Lactation Consultation Note  Patient Name: Cheryl Stewart [id number] Date: 04/24/2014 Reason for consult: Follow-up assessment Baby 50 hours of life. Mom able to latch baby to right breast in cradle position. Baby latches deeply, suckles rhythmically with a few swallows noted. Mom able to return-demonstrate hand expression, but no colostrum present. Mom states she just finished nursing on left side and heard swallows while baby suckled. Mom states that she does not believe her breasts are filling. Enc mom to offer breast first at each feeding, and to supplement with formula as she has been if needed based on baby's hunger cues. Referred mom to Baby and Me booklet for number of diapers to expect and EBM storage guidelines. Mom had supply issues with first baby and supplemented with formula as well. Enc mom to call [place] this morning as she stated she needs an appointment. Discussed pumping and returning to work. Mom aware of OP/BFSG and [place] phone assistance.   Maternal Data    Feeding Feeding Type: Breast Fed Length of feed:  (LC assessed first 10 minutes of breast feeding.)  LATCH Score/Interventions Latch: Grasps breast easily, tongue down, lips flanged, rhythmical sucking.  Audible Swallowing: A few with stimulation Intervention(s): Skin to skin  Type of Nipple: Everted at rest and after stimulation  Comfort (Breast/Nipple): Soft / non-tender     Hold (Positioning): No assistance needed to correctly position infant at breast. Intervention(s): Breastfeeding basics reviewed;Support Pillows  LATCH Score: 9  Lactation Tools Discussed/Used     Consult Status Consult Status: Complete    Cheryl Stewart 04/24/2014, 11:02 AM

## 2014-04-24 NOTE — Progress Notes (Signed)
Patient ID: Cheryl Stewart, female   DOB: March 17, 1986, 28 y.o.   MRN: [id number] Postop day 2 Vital signs normal Fundus firm Lochia moderate And

## 2014-04-25 NOTE — Discharge Summary (Signed)
Obstetric Discharge Summary Reason for Admission: induction of labor Prenatal Procedures: none Intrapartum Procedures: cesarean: low cervical, transverse Postpartum Procedures: none Complications-Operative and Postpartum: none Hemoglobin  Date Value Ref Range Status  04/23/2014 8.7* 12.0 - 15.0 g/dL Final     DELTA CHECK NOTED     REPEATED TO VERIFY     HCT  Date Value Ref Range Status  04/23/2014 27.4* 36.0 - 46.0 % Final    Physical Exam:  General: alert Lochia: appropriate Uterine Fundus: firm Incision: healing well DVT Evaluation: No evidence of DVT seen on physical exam.  Discharge Diagnoses: Term Pregnancy-delivered  Discharge Information: Date: 04/25/2014 Activity: pelvic rest Diet: routine Medications: Percocet Condition: improved Instructions: refer to practice specific booklet Discharge to: home Follow-up Information   Follow up with Frederico Hamman, MD.   Specialty:  Obstetrics and Gynecology   Contact information:   [address] [place] [address] [phone]       Newborn Data: Live born female  Birth Weight: 8 lb 3.9 oz (3740 g) APGAR: 9, 9  Home with mother.  Cheryl Stewart,Cheryl Stewart 04/25/2014, 6:53 AM

## 2014-04-25 NOTE — Lactation Note (Addendum)
This note was copied from the chart of [place]. Lactation Consultation Note  Patient Name: Cheryl Stewart [id number] Date: 04/25/2014 Reason for consult: Follow-up assessment Baby 72 hours of life. Mom experienced BF. Baby nursing in [place] position when [facility] entered room. Baby latched deeply, suckling rhythmically with intermittent swallows noted. Mom reports baby nursed well through the night, 15-20 minutes BF with swallows noted by mom at each feeding. Enc mom to feed with cues. Discussed engorgement prevention/treatment. Referred mom to Baby and Me booklet for number of diapers to expect and EBM storage guidelines. Mom states that she has [facility] appointment. Reviewed returning to work and pumping. Mom aware of OP/BFSG and [place] phone help line. Mom has hand pump.  Maternal Data Has patient been taught Hand Expression?: Yes Does the patient have breastfeeding experience prior to this delivery?: Yes  Feeding Feeding Type:  (BF when LC entered room.) Nipple Type: Slow - flow Length of feed: 5 min  LATCH Score/Interventions Latch: Grasps breast easily, tongue down, lips flanged, rhythmical sucking.  Audible Swallowing: A few with stimulation  Type of Nipple: Everted at rest and after stimulation  Comfort (Breast/Nipple): Soft / non-tender     Hold (Positioning): No assistance needed to correctly position infant at breast.  LATCH Score: 9  Lactation Tools Discussed/Used     Consult Status Consult Status: Complete    Inocente Salles 04/25/2014, 8:47 AM

## 2014-04-25 NOTE — Progress Notes (Signed)
Patient ID: Cheryl Stewart, female   DOB: August 20, 1985, 28 y.o.   MRN: [id number] Postop day 3 Vital signs normal fundus firm incision dry legs negative doing well home to

## 2014-04-25 NOTE — Discharge Instructions (Signed)
if  bleeding more than a period notify me  No sex for 3 weeks See me in 3 weeks  Resume normal activity in am

## 2014-06-02 ENCOUNTER — Encounter (HOSPITAL_COMMUNITY): Payer: Self-pay | Admitting: Obstetrics

## 2015-06-17 ENCOUNTER — Other Ambulatory Visit (HOSPITAL_COMMUNITY): Payer: Self-pay | Admitting: Obstetrics

## 2015-06-17 DIAGNOSIS — D219 Benign neoplasm of connective and other soft tissue, unspecified: Secondary | ICD-10-CM

## 2015-06-17 LAB — PROCEDURE REPORT - SCANNED: Pap: NEGATIVE

## 2015-06-23 ENCOUNTER — Ambulatory Visit (HOSPITAL_COMMUNITY): Payer: BLUE CROSS/BLUE SHIELD

## 2015-06-26 ENCOUNTER — Ambulatory Visit (HOSPITAL_COMMUNITY): Payer: BLUE CROSS/BLUE SHIELD

## 2016-03-12 ENCOUNTER — Encounter: Payer: Self-pay | Admitting: *Deleted

## 2016-03-12 DIAGNOSIS — Z3490 Encounter for supervision of normal pregnancy, unspecified, unspecified trimester: Secondary | ICD-10-CM | POA: Insufficient documentation

## 2016-03-14 ENCOUNTER — Encounter: Payer: Self-pay | Admitting: Obstetrics & Gynecology

## 2016-03-14 ENCOUNTER — Ambulatory Visit (INDEPENDENT_AMBULATORY_CARE_PROVIDER_SITE_OTHER): Payer: BLUE CROSS/BLUE SHIELD | Admitting: Obstetrics & Gynecology

## 2016-03-14 VITALS — BP 114/69 | HR 96 | Temp 98.4°F | Wt 173.0 lb

## 2016-03-14 DIAGNOSIS — O3412 Maternal care for benign tumor of corpus uteri, second trimester: Secondary | ICD-10-CM | POA: Diagnosis not present

## 2016-03-14 DIAGNOSIS — Z1389 Encounter for screening for other disorder: Secondary | ICD-10-CM

## 2016-03-14 DIAGNOSIS — B3731 Acute candidiasis of vulva and vagina: Secondary | ICD-10-CM

## 2016-03-14 DIAGNOSIS — Z331 Pregnant state, incidental: Secondary | ICD-10-CM | POA: Diagnosis not present

## 2016-03-14 DIAGNOSIS — Z3481 Encounter for supervision of other normal pregnancy, first trimester: Secondary | ICD-10-CM

## 2016-03-14 DIAGNOSIS — O34219 Maternal care for unspecified type scar from previous cesarean delivery: Secondary | ICD-10-CM | POA: Diagnosis not present

## 2016-03-14 DIAGNOSIS — B373 Candidiasis of vulva and vagina: Secondary | ICD-10-CM

## 2016-03-14 DIAGNOSIS — D259 Leiomyoma of uterus, unspecified: Secondary | ICD-10-CM | POA: Insufficient documentation

## 2016-03-14 LAB — POCT URINALYSIS DIPSTICK
Bilirubin, UA: NEGATIVE
Blood, UA: NEGATIVE
Glucose, UA: NEGATIVE
Ketones, UA: NEGATIVE
Nitrite, UA: NEGATIVE
Spec Grav, UA: 1.015
Urobilinogen, UA: 0.2
pH, UA: 7

## 2016-03-14 MED ORDER — FLUCONAZOLE 150 MG PO TABS: 150.0000 mg | ORAL_TABLET | Freq: Once | ORAL | 3 refills | Status: AC

## 2016-03-14 NOTE — Progress Notes (Signed)
  Subjective:    Cheryl Stewart is being seen today for her first obstetrical visit.  This is not a planned pregnancy. She is at [redacted]w[redacted]d gestation. Her obstetrical history is significant for previous c-section. Relationship with FOB: significant other, living together. Patient does intend to breast feed. Pregnancy history fully reviewed.  Patient reports:   Review of Systems:   Review of Systems  Objective:     BP 114/69   Pulse 96   Temp 98.4 F (36.9 C)   Wt 173 lb (78.5 kg)   LMP 11/27/2015 (Exact Date)   BMI 28.79 kg/m  Physical Exam  Exam  General Appearance:    Alert, cooperative, no distress, appears stated age  Head:    Normocephalic, without obvious abnormality, atraumatic  Eyes:    conjunctiva/corneas clear, EOM's intact, both eyes  Ears:    Normal external ear canals, both ears  Nose:   Nares normal, septum midline, mucosa normal, no drainage    or sinus tenderness  Throat:   Lips, mucosa, and tongue normal; teeth and gums normal  Neck:   Supple, symmetrical, trachea midline, no adenopathy;    thyroid:  no enlargement/tenderness/nodules  Back:     Symmetric, no curvature, ROM normal, no CVA tenderness  Lungs:     Clear to auscultation bilaterally, respirations unlabored  Chest Wall:    No tenderness or deformity   Heart:    Regular rate and rhythm, S1 and S2 normal, no murmur, rub   or gallop  Breast Exam:    No tenderness, masses, or nipple abnormality  Abdomen:     Soft, non-tender, bowel sounds active all four quadrants,    no masses, no organomegaly. FH@ umbilicus  Genitalia:    Normal female without lesion, discharge or tenderness   Thick white curd like discharge   Extremities:   Extremities normal, atraumatic, no cyanosis or edema  Pulses:   2+ and symmetric all extremities  Skin:   Skin color, texture, turgor normal, no rashes or lesions       Assessment:    Pregnancy: [id number]    Patient Active Problem List   Diagnosis Date Noted  . Previous  cesarean section complicating pregnancy, antepartum condition or complication [id number]  . Supervision of normal pregnancy, antepartum 03/12/2016  S<D pt with uterus enlarged with fibroids  Plan:     Initial labs drawn. Prenatal vitamins. Problem list reviewed and updated. AFP3 discussed: requested. Role of ultrasound in pregnancy discussed; fetal survey: requested. Amniocentesis discussed: not indicated. Follow up in 4 weeks. 60% of 50 min visit spent on counseling and coordination of care.  Diflucan 150mg  po q day x 1   HARRAWAY-SMITH, Taj Arteaga 03/14/2016

## 2016-03-14 NOTE — Progress Notes (Signed)
Pt c/o "extra" vaginal discharge, thick, white. She c/o itching and irritation.

## 2016-03-14 NOTE — Patient Instructions (Signed)

## 2016-03-16 LAB — GC/CHLAMYDIA PROBE AMP
Chlamydia trachomatis, NAA: NEGATIVE
Neisseria gonorrhoeae by PCR: NEGATIVE

## 2016-03-16 LAB — URINE CULTURE, OB REFLEX

## 2016-03-16 LAB — IGP, APTIMA HPV, RFX 16/18,45
HPV Aptima: NEGATIVE
PAP Smear Comment: 0

## 2016-03-16 LAB — CULTURE, OB URINE

## 2016-04-06 ENCOUNTER — Encounter: Payer: Self-pay | Admitting: Obstetrics & Gynecology

## 2016-04-06 DIAGNOSIS — Z8619 Personal history of other infectious and parasitic diseases: Secondary | ICD-10-CM | POA: Insufficient documentation

## 2016-04-06 DIAGNOSIS — A539 Syphilis, unspecified: Secondary | ICD-10-CM | POA: Insufficient documentation

## 2016-04-07 ENCOUNTER — Ambulatory Visit (INDEPENDENT_AMBULATORY_CARE_PROVIDER_SITE_OTHER): Payer: BLUE CROSS/BLUE SHIELD

## 2016-04-07 DIAGNOSIS — Z36 Encounter for antenatal screening of mother: Secondary | ICD-10-CM

## 2016-04-07 DIAGNOSIS — Z3481 Encounter for supervision of other normal pregnancy, first trimester: Secondary | ICD-10-CM

## 2016-04-08 ENCOUNTER — Telehealth: Payer: Self-pay | Admitting: *Deleted

## 2016-04-08 ENCOUNTER — Telehealth: Payer: Self-pay | Admitting: Pediatrics

## 2016-04-08 NOTE — Telephone Encounter (Signed)
Spoke to patient about her RPR results. She has tested positive in the past. She [facility]. Made her aware that she will have a referral to infectious disease also.

## 2016-04-08 NOTE — Telephone Encounter (Signed)
-----   Message from Lavonia Drafts, MD sent at 04/07/2016  8:40 AM EDT ----- She can be referred to the [facility] for tx.  I was not aware that the outside offices did not carry this.  Janit Pagan will assist with showing you how to refer the pt if you don't know.  Thx, clh-S ----- Message ----- From: Dorothyann Gibbs, LPN Sent: [id number]   8:32 AM To: Lavonia Drafts, MD  [facility] does not keep Pen G in stock, pts are usually sent to [facility] for dose, then referred to [facility].  How would you like to proceed? ----- Message ----- From: Lavonia Drafts, MD Sent: 04/06/2016   5:31 PM To: [facility] Clinical Pool  Please call pt. Her RPR was + and her titer was 1:4. She should come in for Pen G 2.52mil unit IM.  Her partner should be notified and treated as well.  Thx, clh-S

## 2016-04-08 NOTE — Telephone Encounter (Signed)
State form faxed to [facility] and I left message for Theadora Rama in regards to results.  Patient needs to be notified and advised to seek treatment at [facility].  She can call [phone] and schedule her appt.  Left message for return call.

## 2016-04-11 ENCOUNTER — Ambulatory Visit (INDEPENDENT_AMBULATORY_CARE_PROVIDER_SITE_OTHER): Payer: BLUE CROSS/BLUE SHIELD | Admitting: Obstetrics & Gynecology

## 2016-04-11 VITALS — BP 108/67 | HR 89 | Temp 97.7°F | Wt 180.1 lb

## 2016-04-11 DIAGNOSIS — O3412 Maternal care for benign tumor of corpus uteri, second trimester: Secondary | ICD-10-CM

## 2016-04-11 DIAGNOSIS — D259 Leiomyoma of uterus, unspecified: Secondary | ICD-10-CM

## 2016-04-11 DIAGNOSIS — Z3482 Encounter for supervision of other normal pregnancy, second trimester: Secondary | ICD-10-CM | POA: Diagnosis not present

## 2016-04-11 NOTE — Progress Notes (Signed)
   PRENATAL VISIT NOTE  Subjective:  Cheryl Stewart is a 30 y.o. [id number] at [redacted]w[redacted]d being seen today for ongoing prenatal care.  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  Patient reports vaginal irritation.  Contractions: Not present. Vag. Bleeding: None.  Movement: Present. Denies leaking of fluid.   The following portions of the patient's history were reviewed and updated as appropriate: allergies, current medications, past family history, past medical history, past social history, past surgical history and problem list. Problem list updated.  Objective:   Vitals:   04/11/16 1507  BP: 108/67  Pulse: 89  Temp: 97.7 F (36.5 C)  Weight: 180 lb 1.6 oz (81.7 kg)    Fetal Status:     Movement: Present     General:  Alert, oriented and cooperative. Patient is in no acute distress.  Skin: Skin is warm and dry. No rash noted.   Cardiovascular: Normal heart rate noted  Respiratory: Normal respiratory effort, no problems with respiration noted  Abdomen: Soft, gravid, appropriate for gestational age. Pain/Pressure: Absent     Pelvic:  Cervical exam deferred        Extremities: Normal range of motion.  Edema: None  Mental Status: Normal mood and affect. Normal behavior. Normal judgment and thought content.   Urinalysis: Urine Protein: Trace Urine Glucose: Negative  Assessment and Plan:  Pregnancy: [id number] at [redacted]w[redacted]d  1. Supervision of normal pregnancy, antepartum, second trimester Nl US done last week   2. Uterine fibroids affecting pregnancy in second trimester, antepartum Sx of BV or yeast vaginitis Nuswab done, white d/c noted Preterm labor symptoms and general obstetric precautions  including but not limited to vaginal bleeding, contractions, leaking of fluid and fetal movement were reviewed in detail with the patient. Please refer to After Visit Summary for other counseling recommendations.  Return in about 4 weeks (around 05/09/2016).  Woodroe Mode, MD

## 2016-04-11 NOTE — Progress Notes (Signed)
Pt. Has concern about bacterial infection.

## 2016-04-14 ENCOUNTER — Telehealth: Payer: Self-pay | Admitting: *Deleted

## 2016-04-14 LAB — NUSWAB VG, CANDIDA 6SP
Atopobium vaginae: HIGH Score — AB
Candida albicans, NAA: POSITIVE — AB
Candida glabrata, NAA: NEGATIVE
Candida krusei, NAA: NEGATIVE
Candida lusitaniae, NAA: NEGATIVE
Candida parapsilosis, NAA: NEGATIVE
Candida tropicalis, NAA: NEGATIVE
Trich vag by NAA: NEGATIVE

## 2016-04-14 NOTE — Telephone Encounter (Signed)
Message left to call office for lab result and BV and yeast medication sent to pharmacy.

## 2016-04-20 ENCOUNTER — Telehealth: Payer: Self-pay | Admitting: *Deleted

## 2016-04-20 NOTE — Telephone Encounter (Signed)
Patient made aware of lab result and medication sent to Pharmacy per Dr Roselie Awkward.[address] [phone].

## 2016-04-24 ENCOUNTER — Encounter: Payer: Self-pay | Admitting: *Deleted

## 2016-05-03 ENCOUNTER — Telehealth: Payer: Self-pay | Admitting: *Deleted

## 2016-05-03 NOTE — Telephone Encounter (Signed)
LM on VM to CB

## 2016-05-04 ENCOUNTER — Telehealth: Payer: Self-pay | Admitting: *Deleted

## 2016-05-04 DIAGNOSIS — B3731 Acute candidiasis of vulva and vagina: Secondary | ICD-10-CM

## 2016-05-04 DIAGNOSIS — B373 Candidiasis of vulva and vagina: Secondary | ICD-10-CM

## 2016-05-04 MED ORDER — TERCONAZOLE 0.8 % VA CREA: 1.0000 | TOPICAL_CREAM | Freq: Every day | VAGINAL | 0 refills | Status: DC

## 2016-05-04 NOTE — Telephone Encounter (Signed)
Patient took the treatment for yeast and BV- but believes she took the yeast medication too soon and it did not work.  She would like a re treatment of yeast. Told patient we could send vaginal treatment to the pharmacy for her.

## 2016-05-09 ENCOUNTER — Encounter: Payer: Self-pay | Admitting: Obstetrics and Gynecology

## 2016-05-11 ENCOUNTER — Ambulatory Visit (INDEPENDENT_AMBULATORY_CARE_PROVIDER_SITE_OTHER): Payer: Medicaid Other | Admitting: Certified Nurse Midwife

## 2016-05-11 DIAGNOSIS — Z3480 Encounter for supervision of other normal pregnancy, unspecified trimester: Secondary | ICD-10-CM

## 2016-05-11 DIAGNOSIS — Z3482 Encounter for supervision of other normal pregnancy, second trimester: Secondary | ICD-10-CM

## 2016-05-11 LAB — PRENATAL PROFILE I(LABCORP)
Antibody Screen: NEGATIVE
Basophils Absolute: 0 10*3/uL (ref 0.0–0.2)
Basos: 0 %
EOS (ABSOLUTE): 0 10*3/uL (ref 0.0–0.4)
Eos: 1 %
Hematocrit: 34 % (ref 34.0–46.6)
Hemoglobin: 10.9 g/dL — ABNORMAL LOW (ref 11.1–15.9)
Hepatitis B Surface Ag: NEGATIVE
Immature Grans (Abs): 0 10*3/uL (ref 0.0–0.1)
Immature Granulocytes: 0 %
Lymphocytes Absolute: 1.5 10*3/uL (ref 0.7–3.1)
Lymphs: 25 %
MCH: 24.6 pg — ABNORMAL LOW (ref 26.6–33.0)
MCHC: 32.1 g/dL (ref 31.5–35.7)
MCV: 77 fL — ABNORMAL LOW (ref 79–97)
Monocytes Absolute: 0.5 10*3/uL (ref 0.1–0.9)
Monocytes: 8 %
Neutrophils Absolute: 4 10*3/uL (ref 1.4–7.0)
Neutrophils: 66 %
Platelets: 165 10*3/uL (ref 150–379)
RBC: 4.43 x10E6/uL (ref 3.77–5.28)
RDW: 16.2 % — ABNORMAL HIGH (ref 12.3–15.4)
RPR Ser Ql: REACTIVE — AB
Rh Factor: POSITIVE
Rubella Antibodies, IGG: 10.7 index (ref >0.99)
WBC: 6 10*3/uL (ref 3.4–10.8)

## 2016-05-11 LAB — RPR, QUANT+TP ABS (REFLEX)
Rapid Plasma Reagin, Quant: 1:4 {titer} — ABNORMAL HIGH
T Pallidum Abs: POSITIVE — AB

## 2016-05-11 LAB — AFP, QUAD SCREEN
DIA Mom Value: 0.85
DIA Value (EIA): 147.5 pg/mL
DSR (By Age)    1 IN: 613
DSR (Second Trimester) 1 IN: 4330
Gestational Age: 15.4 WEEKS
MSAFP Mom: 1.65
MSAFP: 49.1 ng/mL
MSHCG Mom: 0.43
MSHCG: 18996 m[IU]/mL
Maternal Age At EDD: 31 YEARS
Osb Risk: 3696
T18 (By Age): 1:2388 {titer}
Test Results:: NEGATIVE
Weight: 173 [lb_av]
uE3 Mom: 0.66
uE3 Value: 0.43 ng/mL

## 2016-05-11 LAB — HEMOGLOBINOPATHY EVALUATION
HGB C: 0 %
HGB S: 0 %
Hemoglobin A2 Quantitation: 2.8 % (ref 0.7–3.1)
Hemoglobin F Quantitation: 0 % (ref 0.0–2.0)
Hgb A: 97.2 % (ref 94.0–98.0)

## 2016-05-11 LAB — TOXASSURE SELECT 13 (MW), URINE

## 2016-05-11 LAB — HIV ANTIBODY (ROUTINE TESTING W REFLEX): HIV Screen 4th Generation wRfx: NONREACTIVE

## 2016-05-11 NOTE — Progress Notes (Signed)
Subjective:    Cheryl Stewart is a 31 y.o. female being seen today for her obstetrical visit. She is at [redacted]w[redacted]d gestation. Patient reports: no bleeding, no contractions, no cramping, no leaking and occasional vertigo, is skipping meals while at work; snacks and iron rich foods encouraged. . Fetal movement: normal.  Problem List Items Addressed This Visit      Other   Supervision of normal pregnancy, antepartum    Other Visit Diagnoses   None.    Patient Active Problem List   Diagnosis Date Noted  . 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 Previous cesarean section complicating pregnancy, antepartum condition or complication [id number]  . Uterine fibroids affecting pregnancy in second trimester, antepartum 03/14/2016  . Supervision of normal pregnancy, antepartum 03/12/2016   Objective:    BP (!) 102/53   Pulse 96   Temp 98.3 F (36.8 C)   Wt 186 lb 9.6 oz (84.6 kg)   LMP 11/27/2015 (Exact Date)   BMI 31.05 kg/m  FHT: 145 BPM  Uterine Size: 24 cm and size equals dates     Assessment:    Pregnancy @ [redacted]w[redacted]d    H/O anemia: iron rich foods and OTC supplements encouraged  Plan:    Continue OTC gummy vitamins   OBGCT: discussed and ordered for next visit. Signs and symptoms of preterm labor: discussed and handout given.  Labs, problem list reviewed and updated 2 hr GTT planned Follow up in 4 weeks.

## 2016-05-11 NOTE — Progress Notes (Signed)
Patient c/o light headedness, dizziness and sob that comes and goes x 3 weeks. Concerned iron may be low.

## 2016-06-07 ENCOUNTER — Ambulatory Visit (INDEPENDENT_AMBULATORY_CARE_PROVIDER_SITE_OTHER): Payer: Medicaid Other | Admitting: Certified Nurse Midwife

## 2016-06-07 DIAGNOSIS — Z3490 Encounter for supervision of normal pregnancy, unspecified, unspecified trimester: Secondary | ICD-10-CM

## 2016-06-07 DIAGNOSIS — Z3492 Encounter for supervision of normal pregnancy, unspecified, second trimester: Secondary | ICD-10-CM

## 2016-06-07 NOTE — Progress Notes (Signed)
Subjective:    Cheryl Stewart is a 30 y.o. female being seen today for her obstetrical visit. She is at [redacted]w[redacted]d gestation. Patient reports: no complaints . Fetal movement: normal.  Problem List Items Addressed This Visit      Other   Supervision of normal pregnancy, antepartum     Patient Active Problem List   Diagnosis Date Noted  . 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 Previous cesarean section complicating pregnancy, antepartum condition or complication [id number]  . Uterine fibroids affecting pregnancy in second trimester, antepartum 03/14/2016  . Supervision of normal pregnancy, antepartum 03/12/2016   Objective:    BP 105/67   Pulse 92   Wt 193 lb (87.5 kg)   LMP 11/27/2015 (Exact Date)   BMI 32.12 kg/m  FHT: 139 BPM  Uterine Size: 28 cm and size equals dates   Breech position.    Assessment:    Pregnancy @ [redacted]w[redacted]d    Repeat C-section  Plan:   2 hour OGTT scheduled for Friday   OBGCT: discussed and ordered. Signs and symptoms of preterm labor: discussed and handout given.  Labs, problem list reviewed and updated 2 hr GTT planned Follow up in 2 weeks.

## 2016-06-07 NOTE — Progress Notes (Signed)
Pt has GTT scheduled for this week. Pt declines flu vaccine today. Pt would like u/s review.

## 2016-06-08 ENCOUNTER — Encounter: Payer: BLUE CROSS/BLUE SHIELD | Admitting: Certified Nurse Midwife

## 2016-06-08 ENCOUNTER — Encounter (HOSPITAL_COMMUNITY): Payer: Self-pay | Admitting: *Deleted

## 2016-06-08 ENCOUNTER — Other Ambulatory Visit: Payer: Medicaid Other

## 2016-06-10 ENCOUNTER — Other Ambulatory Visit: Payer: Medicaid Other

## 2016-06-10 DIAGNOSIS — Z3490 Encounter for supervision of normal pregnancy, unspecified, unspecified trimester: Secondary | ICD-10-CM

## 2016-06-14 LAB — RPR: RPR Ser Ql: REACTIVE — AB

## 2016-06-14 LAB — CBC
Hematocrit: 30.8 % — ABNORMAL LOW (ref 34.0–46.6)
Hemoglobin: 9.9 g/dL — ABNORMAL LOW (ref 11.1–15.9)
MCH: 24.1 pg — ABNORMAL LOW (ref 26.6–33.0)
MCHC: 32.1 g/dL (ref 31.5–35.7)
MCV: 75 fL — ABNORMAL LOW (ref 79–97)
Platelets: 176 10*3/uL (ref 150–379)
RBC: 4.11 x10E6/uL (ref 3.77–5.28)
RDW: 19.2 % — ABNORMAL HIGH (ref 12.3–15.4)
WBC: 6.2 10*3/uL (ref 3.4–10.8)

## 2016-06-14 LAB — RPR, QUANT+TP ABS (REFLEX)
Rapid Plasma Reagin, Quant: 1:4 {titer} — ABNORMAL HIGH
T Pallidum Abs: POSITIVE — AB

## 2016-06-14 LAB — GLUCOSE TOLERANCE, 2 HOURS W/ 1HR
Glucose, 1 hour: 121 mg/dL (ref 65–179)
Glucose, 2 hour: 109 mg/dL (ref 65–152)
Glucose, Fasting: 79 mg/dL (ref 65–91)

## 2016-06-14 LAB — HIV ANTIBODY (ROUTINE TESTING W REFLEX): HIV Screen 4th Generation wRfx: NONREACTIVE

## 2016-06-27 ENCOUNTER — Encounter: Payer: Medicaid Other | Admitting: Certified Nurse Midwife

## 2016-07-06 ENCOUNTER — Telehealth: Payer: Self-pay

## 2016-07-06 NOTE — Telephone Encounter (Signed)
Returned pt call, no answer, left vm to call.

## 2016-07-07 ENCOUNTER — Other Ambulatory Visit: Payer: Self-pay

## 2016-07-07 MED ORDER — FLUCONAZOLE 150 MG PO TABS: 150.0000 mg | ORAL_TABLET | Freq: Once | ORAL | 0 refills | Status: AC

## 2016-07-07 MED ORDER — METRONIDAZOLE 500 MG PO TABS: 500.0000 mg | ORAL_TABLET | Freq: Two times a day (BID) | ORAL | 0 refills | Status: DC

## 2016-07-07 NOTE — Telephone Encounter (Signed)
Patient called in stating that she has BV again and would like rx sent in addition to Diflucan for yeast, routed to provider for review.

## 2016-07-08 ENCOUNTER — Telehealth: Payer: Self-pay

## 2016-07-08 NOTE — Telephone Encounter (Signed)
Called patient and advised of rx.

## 2016-07-28 ENCOUNTER — Ambulatory Visit (INDEPENDENT_AMBULATORY_CARE_PROVIDER_SITE_OTHER): Payer: BLUE CROSS/BLUE SHIELD | Admitting: Certified Nurse Midwife

## 2016-07-28 VITALS — BP 138/75 | HR 92 | Wt 202.0 lb

## 2016-07-28 DIAGNOSIS — O34219 Maternal care for unspecified type scar from previous cesarean delivery: Secondary | ICD-10-CM

## 2016-07-28 DIAGNOSIS — A539 Syphilis, unspecified: Secondary | ICD-10-CM

## 2016-07-28 DIAGNOSIS — Z3483 Encounter for supervision of other normal pregnancy, third trimester: Secondary | ICD-10-CM

## 2016-07-28 DIAGNOSIS — Z3480 Encounter for supervision of other normal pregnancy, unspecified trimester: Secondary | ICD-10-CM

## 2016-07-28 DIAGNOSIS — Z23 Encounter for immunization: Secondary | ICD-10-CM

## 2016-07-28 NOTE — Progress Notes (Signed)
Subjective:    Cheryl Stewart is a 30 y.o. female being seen today for her obstetrical visit. She is at [redacted]w[redacted]d gestation. Patient reports no complaints. Fetal movement: normal.  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 Previous cesarean section complicating pregnancy, antepartum condition or complication [id number]  . Uterine fibroids affecting pregnancy in second trimester, antepartum 03/14/2016  . Supervision of normal pregnancy, antepartum 03/12/2016   Objective:    BP 138/75   Pulse 92   Wt 202 lb (91.6 kg)   LMP 11/27/2015 (Exact Date)   BMI 33.61 kg/m  FHT:  133 BPM  Uterine Size: 36 cm and size equals dates  Presentation: cephalic     Assessment:    Pregnancy @ [redacted]w[redacted]d weeks   Repeat C-section scheduled 08/26/16  Plan:     labs reviewed, problem list updated GBS planinng TDAP offered, given 07/28/16.     Pediatrician: discussed, [facility] planned Infant feeding: plans to breastfeed. Maternity leave: discussed. Cigarette smoking: never smoked. Orders Placed This Encounter  Procedures  . Tdap vaccine greater than or equal to 7yo IM   No orders of the defined types were placed in this encounter.  Follow up in 1 Week with [facility]BS.

## 2016-08-04 ENCOUNTER — Ambulatory Visit (INDEPENDENT_AMBULATORY_CARE_PROVIDER_SITE_OTHER): Payer: Medicaid Other | Admitting: Family Medicine

## 2016-08-04 ENCOUNTER — Other Ambulatory Visit (HOSPITAL_COMMUNITY)
Admission: RE | Admit: 2016-08-04 | Discharge: 2016-08-04 | Disposition: A | Discharge status: Home / Self Care | Payer: BLUE CROSS/BLUE SHIELD | Source: Ambulatory Visit | Attending: Family Medicine | Admitting: Family Medicine

## 2016-08-04 VITALS — BP 125/75 | HR 92 | Wt 204.0 lb

## 2016-08-04 DIAGNOSIS — Z3480 Encounter for supervision of other normal pregnancy, unspecified trimester: Secondary | ICD-10-CM | POA: Diagnosis not present

## 2016-08-04 DIAGNOSIS — Z113 Encounter for screening for infections with a predominantly sexual mode of transmission: Secondary | ICD-10-CM | POA: Insufficient documentation

## 2016-08-04 DIAGNOSIS — O34219 Maternal care for unspecified type scar from previous cesarean delivery: Secondary | ICD-10-CM

## 2016-08-04 NOTE — Addendum Note (Signed)
Addended by: Tristan Schroeder D on: 08/04/2016 03:27 PM   Modules accepted: Orders

## 2016-08-04 NOTE — Progress Notes (Signed)
   PRENATAL VISIT NOTE  Subjective:  Cheryl Stewart is a 31 y.o. [id number] at [redacted]w[redacted]d being seen today for ongoing prenatal care.  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  Patient reports no complaints.  Contractions: Not present. Vag. Bleeding: None.  Movement: Present. Denies leaking of fluid.   The following portions of the patient's history were reviewed and updated as appropriate: allergies, current medications, past family history, past medical history, past social history, past surgical history and problem list. Problem list updated.  Objective:   Vitals:   08/04/16 1454  BP: 125/75  Pulse: 92  Weight: 204 lb (92.5 kg)    Fetal Status: Fetal Heart Rate (bpm): 134 Fundal Height: 37 cm Movement: Present  Presentation: Vertex  General:  Alert, oriented and cooperative. Patient is in no acute distress.  Skin: Skin is warm and dry. No rash noted.   Cardiovascular: Normal heart rate noted  Respiratory: Normal respiratory effort, no problems with respiration noted  Abdomen: Soft, gravid, appropriate for gestational age. Pain/Pressure: Absent     Pelvic:  Cervical exam performed Dilation: Closed Effacement (%): Thick    Extremities: Normal range of motion.  Edema: None  Mental Status: Normal mood and affect. Normal behavior. Normal judgment and thought content.   Assessment and Plan:  Pregnancy: [id number] at [redacted]w[redacted]d  1. Supervision of other normal pregnancy, antepartum FHT and FH normal. Cultures today   2. Previous cesarean section complicating pregnancy, antepartum condition or complication Scheduled at 39 weeks  Preterm labor symptoms and general obstetric precautions including but  not limited to vaginal bleeding, contractions, leaking of fluid and fetal movement were reviewed in detail with the patient. Please refer to After Visit Summary for other counseling recommendations.  No Follow-up on file.   Truett Mainland, DO

## 2016-08-04 NOTE — Progress Notes (Signed)
Patient states that she feels good today, reports good fetal movement.

## 2016-08-06 LAB — STREP GP B NAA: Strep Gp B NAA: POSITIVE — AB

## 2016-08-08 LAB — GC/CHLAMYDIA PROBE AMP (~~LOC~~) NOT AT ARMC
Chlamydia: NEGATIVE
Neisseria Gonorrhea: NEGATIVE

## 2016-08-12 ENCOUNTER — Ambulatory Visit (INDEPENDENT_AMBULATORY_CARE_PROVIDER_SITE_OTHER): Payer: Medicaid Other | Admitting: Certified Nurse Midwife

## 2016-08-12 VITALS — BP 107/70 | HR 111 | Temp 98.0°F | Wt 205.5 lb

## 2016-08-12 DIAGNOSIS — D259 Leiomyoma of uterus, unspecified: Secondary | ICD-10-CM

## 2016-08-12 DIAGNOSIS — O34219 Maternal care for unspecified type scar from previous cesarean delivery: Secondary | ICD-10-CM

## 2016-08-12 DIAGNOSIS — Z3483 Encounter for supervision of other normal pregnancy, third trimester: Secondary | ICD-10-CM

## 2016-08-12 DIAGNOSIS — Z3480 Encounter for supervision of other normal pregnancy, unspecified trimester: Secondary | ICD-10-CM

## 2016-08-12 DIAGNOSIS — O99820 Streptococcus B carrier state complicating pregnancy: Secondary | ICD-10-CM

## 2016-08-12 DIAGNOSIS — A539 Syphilis, unspecified: Secondary | ICD-10-CM

## 2016-08-12 DIAGNOSIS — O3413 Maternal care for benign tumor of corpus uteri, third trimester: Secondary | ICD-10-CM

## 2016-08-12 DIAGNOSIS — O98113 Syphilis complicating pregnancy, third trimester: Secondary | ICD-10-CM

## 2016-08-12 NOTE — Patient Instructions (Addendum)
Third Trimester of Pregnancy The third trimester is from week 29 through week 40 (months 7 through 9). The third trimester is a time when the unborn baby (fetus) is growing rapidly. At the end of the ninth month, the fetus is about 20 inches in length and weighs 6-10 pounds. Body changes during your third trimester Your body goes through many changes during pregnancy. The changes vary from woman to woman. During the third trimester:  Your weight will continue to increase. You can expect to gain 25-35 pounds (11-16 kg) by the end of the pregnancy.  You may begin to get stretch marks on your hips, abdomen, and breasts.  You may urinate more often because the fetus is moving lower into your pelvis and pressing on your bladder.  You may develop or continue to have heartburn. This is caused by increased hormones that slow down muscles in the digestive tract.  You may develop or continue to have constipation because increased hormones slow digestion and cause the muscles that push waste through your intestines to relax.  You may develop hemorrhoids. These are swollen veins (varicose veins) in the rectum that can itch or be painful.  You may develop swollen, bulging veins (varicose veins) in your legs.  You may have increased body aches in the pelvis, back, or thighs. This is due to weight gain and increased hormones that are relaxing your joints.  You may have changes in your hair. These can include thickening of your hair, rapid growth, and changes in texture. Some women also have hair loss during or after pregnancy, or hair that feels dry or thin. Your hair will most likely return to normal after your baby is born.  Your breasts will continue to grow and they will continue to become tender. A yellow fluid (colostrum) may leak from your breasts. This is the first milk you are producing for your baby.  Your belly button may stick out.  You may notice more swelling in your hands, face, or  ankles.  You may have increased tingling or numbness in your hands, arms, and legs. The skin on your belly may also feel numb.  You may feel short of breath because of your expanding uterus.  You may have more problems sleeping. This can be caused by the size of your belly, increased need to urinate, and an increase in your body's metabolism.  You may notice the fetus "dropping," or moving lower in your abdomen.  You may have increased vaginal discharge.  Your cervix becomes thin and soft (effaced) near your due date. What to expect at prenatal visits You will have prenatal exams every 2 weeks until week 36. Then you will have weekly prenatal exams. During a routine prenatal visit:  You will be weighed to make sure you and the fetus are growing normally.  Your blood pressure will be taken.  Your abdomen will be measured to track your baby's growth.  The fetal heartbeat will be listened to.  Any test results from the previous visit will be discussed.  You may have a cervical check near your due date to see if you have effaced. At around 36 weeks, your health care provider will check your cervix. At the same time, your health care provider will also perform a test on the secretions of the vaginal tissue. This test is to determine if a type of bacteria, Group B streptococcus, is present. Your health care provider will explain this further. Your health care provider may ask you:    What your birth plan is.  How you are feeling.  If you are feeling the baby move.  If you have had any abnormal symptoms, such as leaking fluid, bleeding, severe headaches, or abdominal cramping.  If you are using any tobacco products, including cigarettes, chewing tobacco, and electronic cigarettes.  If you have any questions. Other tests or screenings that may be performed during your third trimester include:  Blood tests that check for low iron levels (anemia).  Fetal testing to check the health,  activity level, and growth of the fetus. Testing is done if you have certain medical conditions or if there are problems during the pregnancy.  Nonstress test (NST). This test checks the health of your baby to make sure there are no signs of problems, such as the baby not getting enough oxygen. During this test, a belt is placed around your belly. The baby is made to move, and its heart rate is monitored during movement. What is false labor? False labor is a condition in which you feel small, irregular tightenings of the muscles in the womb (contractions) that eventually go away. These are called Braxton Hicks contractions. Contractions may last for hours, days, or even weeks before true labor sets in. If contractions come at regular intervals, become more frequent, increase in intensity, or become painful, you should see your health care provider. What are the signs of labor?  Abdominal cramps.  Regular contractions that start at 10 minutes apart and become stronger and more frequent with time.  Contractions that start on the top of the uterus and spread down to the lower abdomen and back.  Increased pelvic pressure and dull back pain.  A watery or bloody mucus discharge that comes from the vagina.  Leaking of amniotic fluid. This is also known as your "water breaking." It could be a slow trickle or a gush. Let your doctor know if it has a color or strange odor. If you have any of these signs, call your health care provider right away, even if it is before your due date. Follow these instructions at home: Eating and drinking  Continue to eat regular, healthy meals.  Do not eat:  Raw meat or meat spreads.  Unpasteurized milk or cheese.  Unpasteurized juice.  Store-made salad.  Refrigerated smoked seafood.  Hot dogs or deli meat, unless they are piping hot.  More than 6 ounces of albacore tuna a week.  Shark, swordfish, king mackerel, or tile fish.  Store-made salads.  Raw  sprouts, such as mung bean or alfalfa sprouts.  Take prenatal vitamins as told by your health care provider.  Take 1000 mg of calcium daily as told by your health care provider.  If you develop constipation:  Take over-the-counter or prescription medicines.  Drink enough fluid to keep your urine clear or pale yellow.  Eat foods that are high in fiber, such as fresh fruits and vegetables, whole grains, and beans.  Limit foods that are high in fat and processed sugars, such as fried and sweet foods. Activity  Exercise only as directed by your health care provider. Healthy pregnant women should aim for 2 hours and 30 minutes of moderate exercise per week. If you experience any pain or discomfort while exercising, stop.  Avoid heavy lifting.  Do not exercise in extreme heat or humidity, or at high altitudes.  Wear low-heel, comfortable shoes.  Practice good posture.  Do not travel far distances unless it is absolutely necessary and only with the approval   of your health care provider.  Wear your seat belt at all times while in a car, on a bus, or on a plane.  Take frequent breaks and rest with your legs elevated if you have leg cramps or low back pain.  Do not use hot tubs, steam rooms, or saunas.  You may continue to have sex unless your health care provider tells you otherwise. Lifestyle  Do not use any products that contain nicotine or tobacco, such as cigarettes and e-cigarettes. If you need help quitting, ask your health care provider.  Do not drink alcohol.  Do not use any medicinal herbs or unprescribed drugs. These chemicals affect the formation and growth of the baby.  If you develop varicose veins:  Wear support pantyhose or compression stockings as told by your healthcare provider.  Elevate your feet for 15 minutes, 3-4 times a day.  Wear a supportive maternity bra to help with breast tenderness. General instructions  Take over-the-counter and prescription  medicines only as told by your health care provider. There are medicines that are either safe or unsafe to take during pregnancy.  Take warm sitz baths to soothe any pain or discomfort caused by hemorrhoids. Use hemorrhoid cream or witch hazel if your health care provider approves.  Avoid cat litter boxes and soil used by cats. These carry germs that can cause birth defects in the baby. If you have a cat, ask someone to clean the litter box for you.  To prepare for the arrival of your baby:  Take prenatal classes to understand, practice, and ask questions about the labor and delivery.  Make a trial run to the hospital.  Visit the hospital and tour the maternity area.  Arrange for maternity or paternity leave through employers.  Arrange for family and friends to take care of pets while you are in the hospital.  Purchase a rear-facing car seat and make sure you know how to install it in your car.  Pack your hospital bag.  Prepare the baby's nursery. Make sure to remove all pillows and stuffed animals from the baby's crib to prevent suffocation.  Visit your dentist if you have not gone during your pregnancy. Use a soft toothbrush to brush your teeth and be gentle when you floss.  Keep all prenatal follow-up visits as told by your health care provider. This is important. Contact a health care provider if:  You are unsure if you are in labor or if your water has broken.  You become dizzy.  You have mild pelvic cramps, pelvic pressure, or nagging pain in your abdominal area.  You have lower back pain.  You have persistent nausea, vomiting, or diarrhea.  You have an unusual or bad smelling vaginal discharge.  You have pain when you urinate. Get help right away if:  You have a fever.  You are leaking fluid from your vagina.  You have spotting or bleeding from your vagina.  You have severe abdominal pain or cramping.  You have rapid weight loss or weight gain.  You have  shortness of breath with chest pain.  You notice sudden or extreme swelling of your face, hands, ankles, feet, or legs.  Your baby makes fewer than 10 movements in 2 hours.  You have severe headaches that do not go away with medicine.  You have vision changes. Summary  The third trimester is from week 29 through week 40, months 7 through 9. The third trimester is a time when the unborn baby (fetus)   is growing rapidly.  During the third trimester, your discomfort may increase as you and your baby continue to gain weight. You may have abdominal, leg, and back pain, sleeping problems, and an increased need to urinate.  During the third trimester your breasts will keep growing and they will continue to become tender. A yellow fluid (colostrum) may leak from your breasts. This is the first milk you are producing for your baby.  False labor is a condition in which you feel small, irregular tightenings of the muscles in the womb (contractions) that eventually go away. These are called Braxton Hicks contractions. Contractions may last for hours, days, or even weeks before true labor sets in.  Signs of labor can include: abdominal cramps; regular contractions that start at 10 minutes apart and become stronger and more frequent with time; watery or bloody mucus discharge that comes from the vagina; increased pelvic pressure and dull back pain; and leaking of amniotic fluid. This information is not intended to replace advice given to you by your health care provider. Make sure you discuss any questions you have with your health care provider. Document Released: 07/12/2001 Document Revised: 12/24/2015 Document Reviewed: 09/18/2012 Elsevier Interactive Patient Education  2017 [employer]  

## 2016-08-12 NOTE — Progress Notes (Signed)
Patient states that she feels good today, reports good fetal movement.

## 2016-08-12 NOTE — Progress Notes (Signed)
   PRENATAL VISIT NOTE  Subjective:  Cheryl Stewart is a 31 y.o. [id number] at [redacted]w[redacted]d being seen today for ongoing prenatal care.  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  Patient reports no complaints.  Contractions: Not present. Vag. Bleeding: None.  Movement: Present. Denies leaking of fluid.   The following portions of the patient's history were reviewed and updated as appropriate: allergies, current medications, past family history, past medical history, past social history, past surgical history and problem list. Problem list updated.  Objective:   Vitals:   08/12/16 1037  BP: 107/70  Pulse: (!) 111  Temp: 98 F (36.7 C)  Weight: 205 lb 8 oz (93.2 kg)    Fetal Status: Fetal Heart Rate (bpm): 130 Fundal Height: 38 cm Movement: Present  Presentation: Vertex  General:  Alert, oriented and cooperative. Patient is in no acute distress.  Skin: Skin is warm and dry. No rash noted.   Cardiovascular: Normal heart rate noted  Respiratory: Normal respiratory effort, no problems with respiration noted  Abdomen: Soft, gravid, appropriate for gestational age. Pain/Pressure: Absent     Pelvic:  Cervical exam deferred        Extremities: Normal range of motion.  Edema: None  Mental Status: Normal mood and affect. Normal behavior. Normal judgment and thought content.   Assessment and Plan:  Pregnancy: [id number] at [redacted]w[redacted]d  1. Supervision of other normal pregnancy, antepartum  + HSV: on valtrex, +GBS: PCN if labors.   2. Previous cesarean section complicating pregnancy, antepartum condition or complication    Desires repeat  C-section.  Previous LTCS X2.  Repeat LTCS scheduled for 08/26/16.    3. 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 Please refer to After Visit Summary for other counseling recommendations.  Return in about 1 week (around 08/19/2016) for [place].   Morene Crocker, CNM

## 2016-08-16 ENCOUNTER — Telehealth: Payer: Self-pay | Admitting: *Deleted

## 2016-08-16 NOTE — Telephone Encounter (Signed)
Call placed to pt. No answer, lm on vm. Need to verify fax number that pt would like forms faxed to. Then number she left is incorrect.

## 2016-08-18 ENCOUNTER — Telehealth (HOSPITAL_COMMUNITY): Payer: Self-pay | Admitting: *Deleted

## 2016-08-18 NOTE — Telephone Encounter (Signed)
Preadmission screen  

## 2016-08-22 ENCOUNTER — Encounter (HOSPITAL_COMMUNITY): Payer: Self-pay

## 2016-08-22 ENCOUNTER — Telehealth (HOSPITAL_COMMUNITY): Payer: Self-pay | Admitting: *Deleted

## 2016-08-22 NOTE — Telephone Encounter (Signed)
Preadmission screen  

## 2016-08-24 ENCOUNTER — Inpatient Hospital Stay (HOSPITAL_COMMUNITY): Payer: BLUE CROSS/BLUE SHIELD | Admitting: Anesthesiology

## 2016-08-24 ENCOUNTER — Encounter (HOSPITAL_COMMUNITY): Payer: Self-pay | Admitting: *Deleted

## 2016-08-24 ENCOUNTER — Inpatient Hospital Stay (HOSPITAL_COMMUNITY)
Admission: AD | Admit: 2016-08-24 | Discharge: 2016-08-26 | DRG: 765 | Disposition: A | Discharge status: Home / Self Care | Payer: BLUE CROSS/BLUE SHIELD | Source: Ambulatory Visit | Attending: Obstetrics & Gynecology | Admitting: Obstetrics & Gynecology

## 2016-08-24 ENCOUNTER — Encounter (HOSPITAL_COMMUNITY)
Admission: AD | Disposition: A | Discharge status: Home / Self Care | Payer: Self-pay | Source: Ambulatory Visit | Attending: Obstetrics & Gynecology

## 2016-08-24 DIAGNOSIS — O9902 Anemia complicating childbirth: Secondary | ICD-10-CM | POA: Diagnosis present

## 2016-08-24 DIAGNOSIS — O4202 Full-term premature rupture of membranes, onset of labor within 24 hours of rupture: Secondary | ICD-10-CM | POA: Diagnosis present

## 2016-08-24 DIAGNOSIS — O3410 Maternal care for benign tumor of corpus uteri, unspecified trimester: Secondary | ICD-10-CM | POA: Diagnosis not present

## 2016-08-24 DIAGNOSIS — O34219 Maternal care for unspecified type scar from previous cesarean delivery: Secondary | ICD-10-CM | POA: Diagnosis not present

## 2016-08-24 DIAGNOSIS — Z3A38 38 weeks gestation of pregnancy: Secondary | ICD-10-CM | POA: Diagnosis not present

## 2016-08-24 DIAGNOSIS — O99824 Streptococcus B carrier state complicating childbirth: Secondary | ICD-10-CM | POA: Diagnosis present

## 2016-08-24 DIAGNOSIS — O3413 Maternal care for benign tumor of corpus uteri, third trimester: Secondary | ICD-10-CM | POA: Diagnosis not present

## 2016-08-24 DIAGNOSIS — O9832 Other infections with a predominantly sexual mode of transmission complicating childbirth: Secondary | ICD-10-CM | POA: Diagnosis not present

## 2016-08-24 DIAGNOSIS — O34211 Maternal care for low transverse scar from previous cesarean delivery: Secondary | ICD-10-CM | POA: Diagnosis not present

## 2016-08-24 DIAGNOSIS — D649 Anemia, unspecified: Secondary | ICD-10-CM | POA: Diagnosis present

## 2016-08-24 DIAGNOSIS — Z8249 Family history of ischemic heart disease and other diseases of the circulatory system: Secondary | ICD-10-CM

## 2016-08-24 DIAGNOSIS — A6000 Herpesviral infection of urogenital system, unspecified: Secondary | ICD-10-CM | POA: Diagnosis present

## 2016-08-24 DIAGNOSIS — Z98891 History of uterine scar from previous surgery: Secondary | ICD-10-CM

## 2016-08-24 DIAGNOSIS — D259 Leiomyoma of uterus, unspecified: Secondary | ICD-10-CM | POA: Diagnosis not present

## 2016-08-24 LAB — TYPE AND SCREEN
ABO/RH(D): O POS
Antibody Screen: NEGATIVE

## 2016-08-24 LAB — POCT FERN TEST: POCT Fern Test: POSITIVE

## 2016-08-24 LAB — CBC
HCT: 28.8 % — ABNORMAL LOW (ref 36.0–46.0)
Hemoglobin: 9.1 g/dL — ABNORMAL LOW (ref 12.0–15.0)
MCH: 22.7 pg — ABNORMAL LOW (ref 26.0–34.0)
MCHC: 31.6 g/dL (ref 30.0–36.0)
MCV: 71.8 fL — ABNORMAL LOW (ref 78.0–100.0)
Platelets: 183 10*3/uL (ref 150–400)
RBC: 4.01 MIL/uL (ref 3.87–5.11)
RDW: 17.4 % — ABNORMAL HIGH (ref 11.5–15.5)
WBC: 6.9 10*3/uL (ref 4.0–10.5)

## 2016-08-24 SURGERY — Surgical Case
Anesthesia: Spinal | Site: Abdomen

## 2016-08-24 MED ORDER — OXYTOCIN 10 UNIT/ML IJ SOLN
INTRAMUSCULAR | Status: AC
  Filled 2016-08-24: qty 4

## 2016-08-24 MED ORDER — ONDANSETRON HCL 4 MG/2ML IJ SOLN
INTRAMUSCULAR | Status: AC
  Filled 2016-08-24: qty 2

## 2016-08-24 MED ORDER — MEPERIDINE HCL 25 MG/ML IJ SOLN: 6.2500 mg | INTRAMUSCULAR | Status: DC | PRN

## 2016-08-24 MED ORDER — DEXAMETHASONE SODIUM PHOSPHATE 4 MG/ML IJ SOLN
INTRAMUSCULAR | Status: DC | PRN
  Administered 2016-08-24: 4 mg via INTRAVENOUS

## 2016-08-24 MED ORDER — FAMOTIDINE IN NACL 20-0.9 MG/50ML-% IV SOLN
20.0000 mg | Freq: Once | INTRAVENOUS | Status: AC
  Administered 2016-08-24: 20 mg via INTRAVENOUS
  Filled 2016-08-24: qty 50

## 2016-08-24 MED ORDER — DEXAMETHASONE SODIUM PHOSPHATE 4 MG/ML IJ SOLN
INTRAMUSCULAR | Status: AC
  Filled 2016-08-24: qty 1

## 2016-08-24 MED ORDER — OXYTOCIN 10 UNIT/ML IJ SOLN
INTRAMUSCULAR | Status: DC | PRN
  Administered 2016-08-24: 40 [IU] via INTRAVENOUS

## 2016-08-24 MED ORDER — KETOROLAC TROMETHAMINE 30 MG/ML IJ SOLN
30.0000 mg | Freq: Four times a day (QID) | INTRAMUSCULAR | Status: DC | PRN
  Administered 2016-08-25: 30 mg via INTRAMUSCULAR

## 2016-08-24 MED ORDER — PHENYLEPHRINE 8 MG IN D5W 100 ML (0.08MG/ML) PREMIX OPTIME
INJECTION | INTRAVENOUS | Status: AC
  Filled 2016-08-24: qty 100

## 2016-08-24 MED ORDER — SCOPOLAMINE 1 MG/3DAYS TD PT72
MEDICATED_PATCH | TRANSDERMAL | Status: AC
  Filled 2016-08-24: qty 1

## 2016-08-24 MED ORDER — LACTATED RINGERS IV SOLN
INTRAVENOUS | Status: DC
  Administered 2016-08-24 (×3): via INTRAVENOUS

## 2016-08-24 MED ORDER — PHENYLEPHRINE 8 MG IN D5W 100 ML (0.08MG/ML) PREMIX OPTIME
INJECTION | INTRAVENOUS | Status: DC | PRN
  Administered 2016-08-24: 60 ug/min via INTRAVENOUS

## 2016-08-24 MED ORDER — KETOROLAC TROMETHAMINE 30 MG/ML IJ SOLN: 30.0000 mg | Freq: Once | INTRAMUSCULAR | Status: DC

## 2016-08-24 MED ORDER — DEXTROSE 5 % IV SOLN
2.0000 g | Freq: Once | INTRAVENOUS | Status: AC
  Administered 2016-08-24: 2 g via INTRAVENOUS
  Filled 2016-08-24: qty 2

## 2016-08-24 MED ORDER — MORPHINE SULFATE (PF) 0.5 MG/ML IJ SOLN
INTRAMUSCULAR | Status: AC
  Filled 2016-08-24: qty 10

## 2016-08-24 MED ORDER — MORPHINE SULFATE (PF) 0.5 MG/ML IJ SOLN
INTRAMUSCULAR | Status: DC | PRN
  Administered 2016-08-24: .2 mg via INTRATHECAL

## 2016-08-24 MED ORDER — FENTANYL CITRATE (PF) 100 MCG/2ML IJ SOLN
INTRAMUSCULAR | Status: AC
  Filled 2016-08-24: qty 2

## 2016-08-24 MED ORDER — PROMETHAZINE HCL 25 MG/ML IJ SOLN: 6.2500 mg | INTRAMUSCULAR | Status: DC | PRN

## 2016-08-24 MED ORDER — KETOROLAC TROMETHAMINE 30 MG/ML IJ SOLN: 30.0000 mg | Freq: Four times a day (QID) | INTRAMUSCULAR | Status: DC | PRN

## 2016-08-24 MED ORDER — SCOPOLAMINE 1 MG/3DAYS TD PT72
MEDICATED_PATCH | TRANSDERMAL | Status: DC | PRN
  Administered 2016-08-24: 1 via TRANSDERMAL

## 2016-08-24 MED ORDER — HYDROMORPHONE HCL 1 MG/ML IJ SOLN: 0.2500 mg | INTRAMUSCULAR | Status: DC | PRN

## 2016-08-24 MED ORDER — SOD CITRATE-CITRIC ACID 500-334 MG/5ML PO SOLN
30.0000 mL | Freq: Once | ORAL | Status: AC
  Administered 2016-08-24: 30 mL via ORAL
  Filled 2016-08-24: qty 15

## 2016-08-24 MED ORDER — SODIUM CHLORIDE 0.9 % IR SOLN
Status: DC | PRN
  Administered 2016-08-24: 1

## 2016-08-24 MED ORDER — ONDANSETRON HCL 4 MG/2ML IJ SOLN
INTRAMUSCULAR | Status: DC | PRN
  Administered 2016-08-24: 4 mg via INTRAVENOUS

## 2016-08-24 MED ORDER — FENTANYL CITRATE (PF) 100 MCG/2ML IJ SOLN
INTRAMUSCULAR | Status: DC | PRN
  Administered 2016-08-24: 50 ug via INTRAVENOUS
  Administered 2016-08-24: 20 ug via INTRATHECAL

## 2016-08-24 MED ORDER — BUPIVACAINE IN DEXTROSE 0.75-8.25 % IT SOLN
INTRATHECAL | Status: DC | PRN
  Administered 2016-08-24: 1.4 mL via INTRATHECAL

## 2016-08-24 SURGICAL SUPPLY — 35 items
ADH SKN CLS APL DERMABOND .7 (GAUZE/BANDAGES/DRESSINGS) ×1
CHLORAPREP W/TINT 26ML (MISCELLANEOUS) ×2 IMPLANT
CLAMP CORD UMBIL (MISCELLANEOUS) IMPLANT
CLOTH BEACON ORANGE TIMEOUT ST (SAFETY) ×2 IMPLANT
DERMABOND ADVANCED (GAUZE/BANDAGES/DRESSINGS) ×1
DERMABOND ADVANCED .7 DNX12 (GAUZE/BANDAGES/DRESSINGS) IMPLANT
DRSG OPSITE POSTOP 4X10 (GAUZE/BANDAGES/DRESSINGS) ×2 IMPLANT
ELECT REM PT RETURN 9FT ADLT (ELECTROSURGICAL) ×2
ELECTRODE REM PT RTRN 9FT ADLT (ELECTROSURGICAL) ×1 IMPLANT
EXTRACTOR VACUUM M CUP 4 TUBE (SUCTIONS) IMPLANT
GAUZE SPONGE 4X4 12PLY STRL LF (GAUZE/BANDAGES/DRESSINGS) ×2 IMPLANT
GLOVE BIOGEL PI IND STRL 7.0 (GLOVE) ×3 IMPLANT
GLOVE BIOGEL PI INDICATOR 7.0 (GLOVE) ×3
GLOVE ECLIPSE 7.0 STRL STRAW (GLOVE) ×2 IMPLANT
GOWN STRL REUS W/TWL LRG LVL3 (GOWN DISPOSABLE) ×4 IMPLANT
KIT ABG SYR 3ML LUER SLIP (SYRINGE) IMPLANT
NDL HYPO 25X5/8 SAFETYGLIDE (NEEDLE) ×1 IMPLANT
NEEDLE HYPO 22GX1.5 SAFETY (NEEDLE) ×2 IMPLANT
NEEDLE HYPO 25X5/8 SAFETYGLIDE (NEEDLE) ×2 IMPLANT
NS IRRIG 1000ML POUR BTL (IV SOLUTION) ×2 IMPLANT
PACK C SECTION WH (CUSTOM PROCEDURE TRAY) ×2 IMPLANT
PAD ABD 7.5X8 STRL (GAUZE/BANDAGES/DRESSINGS) ×2 IMPLANT
PAD ABD 8X7 1/2 STERILE (GAUZE/BANDAGES/DRESSINGS) ×1 IMPLANT
PAD OB MATERNITY 4.3X12.25 (PERSONAL CARE ITEMS) ×2 IMPLANT
PENCIL SMOKE EVAC W/HOLSTER (ELECTROSURGICAL) ×2 IMPLANT
RTRCTR C-SECT PINK 25CM LRG (MISCELLANEOUS) IMPLANT
STAPLER VISISTAT 35W (STAPLE) ×1 IMPLANT
SUT PDS AB 0 CTX 36 PDP370T (SUTURE) IMPLANT
SUT PLAIN 2 0 XLH (SUTURE) IMPLANT
SUT VIC AB 0 CTX 36 (SUTURE) ×4
SUT VIC AB 0 CTX36XBRD ANBCTRL (SUTURE) ×2 IMPLANT
SUT VIC AB 4-0 KS 27 (SUTURE) ×2 IMPLANT
SYR CONTROL 10ML LL (SYRINGE) ×2 IMPLANT
TOWEL OR 17X24 6PK STRL BLUE (TOWEL DISPOSABLE) ×2 IMPLANT
TRAY FOLEY CATH SILVER 14FR (SET/KITS/TRAYS/PACK) ×2 IMPLANT

## 2016-08-24 NOTE — MAU Note (Signed)
Pt reports large gush of clear fluid around 1600, had been having lower back pain all day, began having some lower abd after SROM, no bleeding.  Scheduled C/S.

## 2016-08-24 NOTE — Anesthesia Procedure Notes (Signed)
Spinal  Patient location during procedure: OR Start time: 08/24/2016 10:14 PM End time: 08/24/2016 10:20 PM Staffing Anesthesiologist: Lyn Hollingshead Performed: anesthesiologist  Preanesthetic Checklist Completed: patient identified, surgical consent, pre-op evaluation, timeout performed, IV checked, risks and benefits discussed and monitors and equipment checked Spinal Block Patient position: sitting Prep: site prepped and draped and DuraPrep Patient monitoring: heart rate, cardiac monitor, continuous pulse ox and blood pressure Approach: midline Location: L3-4 Injection technique: single-shot Needle Needle type: Sprotte  Needle gauge: 24 G Needle length: 9 cm Needle insertion depth: 6 cm Assessment Sensory level: T4

## 2016-08-24 NOTE — Anesthesia Preprocedure Evaluation (Signed)
Anesthesia Evaluation  Patient identified by MRN, date of birth, ID band Patient awake    Reviewed: Allergy & Precautions, H&P , NPO status , Patient's Chart, lab work & pertinent test results  Airway Mallampati: II  TM Distance: >3 FB Neck ROM: full    Dental no notable dental hx.    Pulmonary neg pulmonary ROS,    Pulmonary exam normal        Cardiovascular negative cardio ROS Normal cardiovascular exam     Neuro/Psych negative neurological ROS  negative psych ROS   GI/Hepatic negative GI ROS, Neg liver ROS,   Endo/Other  negative endocrine ROS  Renal/GU negative Renal ROS     Musculoskeletal   Abdominal (+) + obese,   Peds  Hematology negative hematology ROS (+)   Anesthesia Other Findings   Reproductive/Obstetrics (+) Pregnancy                             Anesthesia Physical Anesthesia Plan  ASA: II  Anesthesia Plan: Spinal   Post-op Pain Management:    Induction:   Airway Management Planned:   Additional Equipment:   Intra-op Plan:   Post-operative Plan:   Informed Consent: I have reviewed the patients History and Physical, chart, labs and discussed the procedure including the risks, benefits and alternatives for the proposed anesthesia with the patient or authorized representative who has indicated his/her understanding and acceptance.     Plan Discussed with: CRNA and Surgeon  Anesthesia Plan Comments:         Anesthesia Quick Evaluation

## 2016-08-24 NOTE — Transfer of Care (Signed)
Immediate Anesthesia Transfer of Care Note  Patient: Kimmerly Mctague  Procedure(s) Performed: Procedure(s): CESAREAN SECTION (N/A)  Patient Location: PACU  Anesthesia Type:Spinal  Level of Consciousness: awake, alert  and oriented  Airway & Oxygen Therapy: Patient Spontanous Breathing  Post-op Assessment: Report given to RN and Post -op Vital signs reviewed and stable HR 85, RR 20, Sao2 100%, BP 131/70  Post vital signs: Reviewed and stable  Last Vitals:  Vitals:   08/24/16 1644  BP: 120/69  Pulse: 93  Resp: 18  Temp: 36.9 C    Last Pain:  Vitals:   08/24/16 1646  TempSrc:   PainSc: 4       Patients Stated Pain Goal: 0 ([id number])  Complications: No apparent anesthesia complications

## 2016-08-24 NOTE — MAU Provider Note (Signed)
Ms. Cheryl Stewart is a 31 y.o. [id number] at [redacted]w[redacted]d  who presents to MAU today complaining of LOF since 1600 today. She states few irregular contractions. She reports good fetal movement. She denies vaginal bleeding or complications with the pregnancy.   Past Medical History:  Diagnosis Date  . Genital herpes   . LGSIL (low grade squamous intraepithelial dysplasia) 07/12  . Vaginal Pap smear, abnormal      BP 120/69 (BP Location: Right Arm)   Pulse 93   Temp 98.4 F (36.9 C) (Oral)   Resp 18   LMP 11/27/2015 (Exact Date)   SpO2 100%  GENERAL: Well-developed, well-nourished female in no acute distress.  HEAD: Normocephalic, atraumatic.  CHEST: Normal effort of breathing, regular heart rate ABDOMEN: Soft, nontender, nondistended.  PELVIC: Normal external female genitalia. Vagina is pink and rugated.  Normal discharge.  + pooling. Gravid uterus.   EXTREMITIES: No cyanosis, clubbing, or edema  Fern - Positive  Fetal Monitoring:  Baseline: 120 bpm, moderate variability, + accelerations, no declerations Contractions: few, irregular  A: [facility]UP at [redacted]w[redacted]d SROM  P: Report given to RN to contact MD on call for further instructions  Luvenia Redden, PA-C 08/24/2016 4:59 PM

## 2016-08-24 NOTE — MAU Note (Signed)
Dr. Vanetta Shawl in to discuss plan of care with pt.

## 2016-08-24 NOTE — Op Note (Signed)
Preoperative diagnosis:  1.  Intrauterine pregnancy at [redacted]w[redacted]d  weeks gestation                                         2.  Previous Caesarean section x 2                                         3.  Declines TOL   Postoperative diagnosis:  Same as above   Procedure:  Repeat cesarean section  Surgeon:  Florian Buff MD  Assistant:    Anesthesia: Spinal  Findings:  .    Over a low transverse incision was delivered a viable female with Apgars of 9 and 9 weighing pending lbs.  oz. Uterus, tubes and ovaries were all normal.  There were no other significant findings  Description of operation:  Patient was taken to the operating room and placed in the sitting position where she underwent a spinal anesthetic. She was then placed in the supine position with tilt to the left side. When adequate anesthetic level was obtained she was prepped and draped in usual sterile fashion and a Foley catheter was placed. A Pfannenstiel skin incision was made and carried down sharply to the rectus fascia which was scored in the midline extended laterally. The fascia was taken off the muscles both superiorly and without difficulty. The muscles were divided.  The peritoneal cavity was entered.  Bladder blade was placed, no bladder flap was created.  A low transverse hysterotomy incision was made and delivered a viable female  infant at 2242 with Apgars of 9 and 9 weighingpending lbs  oz.  Cord pH  was not done. The uterus was exteriorized. It was closed in 2 layers, the first being a running interlocking layer and the second being an imbricating layer using 0 monocryl on a CTX needle. There was good resulting hemostasis. The uterus tubes and ovaries were all normal. Peritoneal cavity was irrigated vigorously. The muscles and peritoneum were reapproximated loosely. The fascia was closed using 0 Vicryl in running fashion. Subcutaneous tissue was made hemostatic and irrigated. The skin was closed using skin staples. Blood loss for  the procedure was 750 cc. The patient received a Cefotan prophylactically. The patient was taken to the recovery room in good stable condition with all counts being correct x3.  EBL 750 cc  Significant subcutaneous scar tissue, please leave staples in for 1 week  Skyrah Krupp H 08/24/2016 11:16 PM

## 2016-08-24 NOTE — H&P (Signed)
LABOR AND DELIVERY ADMISSION HISTORY AND PHYSICAL NOTE  Cheryl Stewart is a 31 y.o. female [phone] with IUP at [redacted]w[redacted]d by LMP c/w 2nd trim [place] presenting for SROM.   Patient states at 16:00, her water broke. She came to MAU found to have grossly ROM with pooling on SSE. Her last meal (rice, chicken, OJ) was at 3:50pm.   Patient has had two previous cesarean sections, her initial CS was due to failed progress of labor (dilated only to 4 cm). Second CS was due to failed TOLAC, made it to complete dilation but had NRFHT. No other abdominal surgeries.  She reports positive fetal movement. She denies vaginal bleeding.  Prenatal History/Complications:  Past Medical History: Past Medical History:  Diagnosis Date  . Genital herpes   . LGSIL (low grade squamous intraepithelial dysplasia) 07/12  . Vaginal Pap smear, abnormal     Past Surgical History: Past Surgical History:  Procedure Laterality Date  . CESAREAN SECTION  2009  . CESAREAN SECTION N/A 04/22/2014   Procedure: CESAREAN SECTION;  Surgeon: Frederico Hamman, MD;  Location: [facility];  Service: Obstetrics;  Laterality: N/A;  . COLPOSCOPY  01/2011    Obstetrical History: OB History    Gravida Para Term Preterm AB Living   5 2 2   2 2    SAB TAB Ectopic Multiple Live Births     2     2      Social History: Social History   Social History  . Marital status: Single    Spouse name: N/A  . Number of children: N/A  . Years of education: N/A   Social History Main Topics  . Smoking status: Never Smoker  . Smokeless tobacco: Never Used  . Alcohol use No     Comment: occasionally; none since May  . Drug use: No  . Sexual activity: Yes    Birth control/ protection: Condom   Other Topics Concern  . None   Social History Narrative  . None    Family History: Family History  Problem Relation Age of Onset  . Hypertension Father     Allergies: No Known Allergies  Prescriptions Prior to Admission  Medication Sig Dispense  Refill Last Dose  . valACYclovir (VALTREX) 1000 MG tablet Take 1,000 mg by mouth daily.    Past Week at Unknown time  . metroNIDAZOLE (FLAGYL) 500 MG tablet Take 1 tablet (500 mg total) by mouth 2 (two) times daily. (Patient not taking: Reported on 08/12/2016) 14 tablet 0 Not Taking     Review of Systems   All systems reviewed and negative except as stated in HPI  Blood pressure 120/69, pulse 93, temperature 98.4 F (36.9 C), temperature source Oral, resp. rate 18, last menstrual period 11/27/2015, SpO2 100 %, unknown if currently breastfeeding. General appearance: alert, cooperative, appears stated age and no distress Lungs: clear to auscultation bilaterally Heart: regular rate and rhythm Abdomen: soft, non-tender; bowel sounds normal Extremities: No calf swelling or tenderness Presentation: cephalic Fetal monitoring: 125 bpm, mod var, +accels, no decels Uterine activity: Rare contractions     Prenatal labs: ABO, Rh: O/Positive/-- (08/14 1442) Antibody: Negative (08/14 1442) Rubella: !Error!  IMMUNE RPR: Reactive (11/10 1045)  HBsAg: Negative (08/14 1442)  HIV: Non Reactive (11/10 1045)  GBS: Positive (01/04 1521)  2 hr Glucola: 79/121/109, normal Genetic screening:  Normal Anatomy US: Normal, female;  Fibroids noted on [place]  Prenatal Transfer Tool  Maternal Diabetes: No Genetic Screening: Normal Maternal Ultrasounds/Referrals: Normal Fetal Ultrasounds  or other Referrals:  None Maternal Substance Abuse:  No Significant Maternal Medications:  None Significant Maternal Lab Results: Lab values include: Group B Strep positive, Other:  RPR titer 1:4 previously, obtaining another RPR.   Results for orders placed or performed during the hospital encounter of 08/24/16 (from the past 24 hour(s))  Fern Test   Collection Time: 08/24/16  5:11 PM  Result Value Ref Range   POCT Fern Test Positive = ruptured amniotic membanes   CBC   Collection Time: 08/24/16  6:32 PM  Result Value  Ref Range   WBC 6.9 4.0 - 10.5 K/uL   RBC 4.01 3.87 - 5.11 MIL/uL   Hemoglobin 9.1 (L) 12.0 - 15.0 g/dL   HCT 28.8 (L) 36.0 - 46.0 %   MCV 71.8 (L) 78.0 - 100.0 fL   MCH 22.7 (L) 26.0 - 34.0 pg   MCHC 31.6 30.0 - 36.0 g/dL   RDW 17.4 (H) 11.5 - 15.5 %   Platelets 183 150 - 400 K/uL    Patient Active Problem List   Diagnosis Date Noted  . GBS (group B Streptococcus carrier), +RV culture, currently pregnant 08/12/2016  . 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 Previous cesarean section complicating pregnancy, antepartum condition or complication [id number]  . Uterine fibroids affecting pregnancy in second trimester, antepartum 03/14/2016  . Supervision of normal pregnancy, antepartum 03/12/2016    Assessment: Cheryl Stewart is a 31 y.o. [id number] at [redacted]w[redacted]d here for SROM, previous cesarean x2.   #Labor: Planned cesarean at 10pm until active labor occurs or other concerns #Pain:  Spinal for cesarean #FWB:  Cat I #ID:   GBS Pos #MOF:  Breast #MOC: LARC #Circ:   Inpatient circ desired  Cheryl Basset, DO [place] [facility], [facility] 08/24/2016, 7:06 PM

## 2016-08-25 ENCOUNTER — Encounter (HOSPITAL_COMMUNITY): Payer: Self-pay

## 2016-08-25 ENCOUNTER — Encounter (HOSPITAL_COMMUNITY)
Admission: RE | Admit: 2016-08-25 | Discharge: 2016-08-25 | Disposition: A | Discharge status: Home / Self Care | Payer: BLUE CROSS/BLUE SHIELD | Source: Ambulatory Visit

## 2016-08-25 ENCOUNTER — Encounter: Payer: BLUE CROSS/BLUE SHIELD | Admitting: Certified Nurse Midwife

## 2016-08-25 DIAGNOSIS — Z3A38 38 weeks gestation of pregnancy: Secondary | ICD-10-CM

## 2016-08-25 DIAGNOSIS — O99824 Streptococcus B carrier state complicating childbirth: Secondary | ICD-10-CM

## 2016-08-25 DIAGNOSIS — O34211 Maternal care for low transverse scar from previous cesarean delivery: Secondary | ICD-10-CM

## 2016-08-25 DIAGNOSIS — Z98891 History of uterine scar from previous surgery: Secondary | ICD-10-CM

## 2016-08-25 HISTORY — DX: Unspecified abnormal cytological findings in specimens from vagina: R87.629

## 2016-08-25 LAB — CBC
HCT: 25.3 % — ABNORMAL LOW (ref 36.0–46.0)
Hemoglobin: 8.2 g/dL — ABNORMAL LOW (ref 12.0–15.0)
MCH: 23.3 pg — ABNORMAL LOW (ref 26.0–34.0)
MCHC: 32.4 g/dL (ref 30.0–36.0)
MCV: 71.9 fL — ABNORMAL LOW (ref 78.0–100.0)
Platelets: 178 10*3/uL (ref 150–400)
RBC: 3.52 MIL/uL — ABNORMAL LOW (ref 3.87–5.11)
RDW: 17.8 % — ABNORMAL HIGH (ref 11.5–15.5)
WBC: 11.6 10*3/uL — ABNORMAL HIGH (ref 4.0–10.5)

## 2016-08-25 MED ORDER — ZOLPIDEM TARTRATE 5 MG PO TABS: 5.0000 mg | ORAL_TABLET | Freq: Every evening | ORAL | Status: DC | PRN

## 2016-08-25 MED ORDER — SIMETHICONE 80 MG PO CHEW
80.0000 mg | CHEWABLE_TABLET | Freq: Three times a day (TID) | ORAL | Status: DC
  Administered 2016-08-25 – 2016-08-26 (×5): 80 mg via ORAL
  Filled 2016-08-25 (×4): qty 1

## 2016-08-25 MED ORDER — PRENATAL MULTIVITAMIN CH
1.0000 | ORAL_TABLET | Freq: Every day | ORAL | Status: DC
  Administered 2016-08-25 – 2016-08-26 (×2): 1 via ORAL
  Filled 2016-08-25 (×2): qty 1

## 2016-08-25 MED ORDER — OXYCODONE HCL 5 MG PO TABS: 5.0000 mg | ORAL_TABLET | ORAL | Status: DC | PRN

## 2016-08-25 MED ORDER — SODIUM CHLORIDE 0.9% FLUSH: 3.0000 mL | INTRAVENOUS | Status: DC | PRN

## 2016-08-25 MED ORDER — NALBUPHINE HCL 10 MG/ML IJ SOLN: 5.0000 mg | INTRAMUSCULAR | Status: DC | PRN

## 2016-08-25 MED ORDER — NALOXONE HCL 2 MG/2ML IJ SOSY
1.0000 ug/kg/h | PREFILLED_SYRINGE | INTRAMUSCULAR | Status: DC | PRN
  Filled 2016-08-25: qty 2

## 2016-08-25 MED ORDER — ACETAMINOPHEN 500 MG PO TABS
1000.0000 mg | ORAL_TABLET | Freq: Four times a day (QID) | ORAL | Status: AC
  Administered 2016-08-25 (×3): 1000 mg via ORAL
  Filled 2016-08-25 (×3): qty 2

## 2016-08-25 MED ORDER — NALBUPHINE HCL 10 MG/ML IJ SOLN: 5.0000 mg | Freq: Once | INTRAMUSCULAR | Status: DC | PRN

## 2016-08-25 MED ORDER — LACTATED RINGERS IV SOLN
INTRAVENOUS | Status: DC
  Administered 2016-08-25: 10:00:00 via INTRAVENOUS

## 2016-08-25 MED ORDER — DIBUCAINE 1 % RE OINT: 1.0000 "application " | TOPICAL_OINTMENT | RECTAL | Status: DC | PRN

## 2016-08-25 MED ORDER — TETANUS-DIPHTH-ACELL PERTUSSIS 5-2.5-18.5 LF-MCG/0.5 IM SUSP: 0.5000 mL | Freq: Once | INTRAMUSCULAR | Status: DC

## 2016-08-25 MED ORDER — OXYTOCIN 40 UNITS IN LACTATED RINGERS INFUSION - SIMPLE MED: 2.5000 [IU]/h | INTRAVENOUS | Status: AC

## 2016-08-25 MED ORDER — DIPHENHYDRAMINE HCL 50 MG/ML IJ SOLN
12.5000 mg | INTRAMUSCULAR | Status: DC | PRN
  Administered 2016-08-25: 12.5 mg via INTRAVENOUS
  Filled 2016-08-25: qty 1

## 2016-08-25 MED ORDER — SCOPOLAMINE 1 MG/3DAYS TD PT72
1.0000 | MEDICATED_PATCH | Freq: Once | TRANSDERMAL | Status: DC
  Filled 2016-08-25: qty 1

## 2016-08-25 MED ORDER — METHYLERGONOVINE MALEATE 0.2 MG/ML IJ SOLN: 0.2000 mg | INTRAMUSCULAR | Status: DC | PRN

## 2016-08-25 MED ORDER — WITCH HAZEL-GLYCERIN EX PADS: 1.0000 "application " | MEDICATED_PAD | CUTANEOUS | Status: DC | PRN

## 2016-08-25 MED ORDER — SIMETHICONE 80 MG PO CHEW: 80.0000 mg | CHEWABLE_TABLET | ORAL | Status: DC | PRN

## 2016-08-25 MED ORDER — SENNOSIDES-DOCUSATE SODIUM 8.6-50 MG PO TABS
2.0000 | ORAL_TABLET | ORAL | Status: DC
  Administered 2016-08-25: 2 via ORAL
  Filled 2016-08-25: qty 2

## 2016-08-25 MED ORDER — ACETAMINOPHEN 325 MG PO TABS: 650.0000 mg | ORAL_TABLET | ORAL | Status: DC | PRN

## 2016-08-25 MED ORDER — KETOROLAC TROMETHAMINE 30 MG/ML IJ SOLN
INTRAMUSCULAR | Status: AC
  Administered 2016-08-25: 30 mg via INTRAMUSCULAR
  Filled 2016-08-25: qty 1

## 2016-08-25 MED ORDER — POLYSACCHARIDE IRON COMPLEX 150 MG PO CAPS
150.0000 mg | ORAL_CAPSULE | Freq: Two times a day (BID) | ORAL | Status: DC
  Administered 2016-08-25 – 2016-08-26 (×3): 150 mg via ORAL
  Filled 2016-08-25 (×3): qty 1

## 2016-08-25 MED ORDER — SIMETHICONE 80 MG PO CHEW
80.0000 mg | CHEWABLE_TABLET | ORAL | Status: DC
  Filled 2016-08-25: qty 1

## 2016-08-25 MED ORDER — COCONUT OIL OIL
1.0000 | TOPICAL_OIL | Status: DC | PRN
Start: 2016-08-25 — End: 2016-08-26

## 2016-08-25 MED ORDER — OXYCODONE HCL 5 MG PO TABS: 10.0000 mg | ORAL_TABLET | ORAL | Status: DC | PRN

## 2016-08-25 MED ORDER — ONDANSETRON HCL 4 MG/2ML IJ SOLN: 4.0000 mg | Freq: Three times a day (TID) | INTRAMUSCULAR | Status: DC | PRN

## 2016-08-25 MED ORDER — MENTHOL 3 MG MT LOZG: 1.0000 | LOZENGE | OROMUCOSAL | Status: DC | PRN

## 2016-08-25 MED ORDER — NALOXONE HCL 0.4 MG/ML IJ SOLN: 0.4000 mg | INTRAMUSCULAR | Status: DC | PRN

## 2016-08-25 MED ORDER — IBUPROFEN 600 MG PO TABS: 600.0000 mg | ORAL_TABLET | Freq: Four times a day (QID) | ORAL | Status: DC | PRN

## 2016-08-25 MED ORDER — DIPHENHYDRAMINE HCL 25 MG PO CAPS
25.0000 mg | ORAL_CAPSULE | ORAL | Status: DC | PRN
  Administered 2016-08-25: 25 mg via ORAL
  Filled 2016-08-25: qty 1

## 2016-08-25 MED ORDER — IBUPROFEN 600 MG PO TABS
600.0000 mg | ORAL_TABLET | Freq: Four times a day (QID) | ORAL | Status: DC
  Administered 2016-08-25 – 2016-08-26 (×5): 600 mg via ORAL
  Filled 2016-08-25 (×5): qty 1

## 2016-08-25 MED ORDER — METHYLERGONOVINE MALEATE 0.2 MG PO TABS: 0.2000 mg | ORAL_TABLET | ORAL | Status: DC | PRN

## 2016-08-25 MED ORDER — DIPHENHYDRAMINE HCL 25 MG PO CAPS: 25.0000 mg | ORAL_CAPSULE | Freq: Four times a day (QID) | ORAL | Status: DC | PRN

## 2016-08-25 NOTE — Lactation Note (Signed)
This note was copied from a baby's chart. Lactation Consultation Note: Mother is experienced with breastfeeding 2 other children for 3 and 8 months. Mother states that she does both breast and formula feed. Mother just gave infant 25 ml of formula. Mother advised to feed infant with feeding cues and at least 8-12 times in 24 hours. Mother receptive. Mother was given Lactation Brochure with information on BFSG'S, out patient services and phone line to assist with questions or concerns.   Patient Name: Boy Danayla Kult [id number] Date: 08/25/2016 Reason for consult: Initial assessment   Maternal Data    Feeding Feeding Type: Formula Nipple Type: Slow - flow Length of feed: 5 min  LATCH Score/Interventions Latch: Grasps breast easily, tongue down, lips flanged, rhythmical sucking. (had to pull lip to flange)  Audible Swallowing: A few with stimulation  Type of Nipple: Everted at rest and after stimulation  Comfort (Breast/Nipple): Soft / non-tender     Hold (Positioning): No assistance needed to correctly position infant at breast.  LATCH Score: 9  Lactation Tools Discussed/Used     Consult Status Consult Status: Follow-up Date: 08/26/16 Follow-up type: In-patient    Jess Barters [facility] 08/25/2016, 12:04 PM

## 2016-08-25 NOTE — Anesthesia Postprocedure Evaluation (Addendum)
Anesthesia Post Note  Patient: Cheryl Stewart  Procedure(s) Performed: Procedure(s) (LRB): CESAREAN SECTION (N/A)  Patient location during evaluation: Mother Baby Anesthesia Type: Spinal Level of consciousness: awake Pain management: satisfactory to patient Vital Signs Assessment: post-procedure vital signs reviewed and stable Respiratory status: spontaneous breathing Cardiovascular status: stable Anesthetic complications: no        Last Vitals:  Vitals:   08/25/16 0345 08/25/16 0445  BP: 119/62 115/65  Pulse: 81 87  Resp: 16 18  Temp: 36.9 C 36.8 C    Last Pain:  Vitals:   08/25/16 0445  TempSrc: Oral  PainSc: 1    Pain Goal: Patients Stated Pain Goal: 0 (08/24/16 1646)               Casimer Lanius

## 2016-08-25 NOTE — Progress Notes (Signed)
Subjective: Postpartum Day #1: Cesarean Delivery, RLTCS after SROM Patient reports incisional pain and tolerating PO.    Objective: Vital signs in last 24 hours: Temp:  [98.1 F (36.7 C)-98.7 F (37.1 C)] 98.3 F (36.8 C) (01/25 0445) Pulse Rate:  [79-101] 87 (01/25 0445) Resp:  [16-27] 18 (01/25 0445) BP: (64-131)/(50-79) 115/65 (01/25 0445) SpO2:  [98 %-100 %] 99 % (01/25 0445)  Physical Exam:  General: alert, cooperative and no distress Lochia: appropriate Uterine Fundus: firm Incision: no significant drainage, no dehiscence, no significant erythema DVT Evaluation: No evidence of DVT seen on physical exam. No cords or calf tenderness. No significant calf/ankle edema. SCDs on.    Recent Labs  08/24/16 1832 08/25/16 0603  HGB 9.1* 8.2*  HCT 28.8* 25.3*    Assessment/Plan: Status post Cesarean section. Doing well postoperatively.  Continue current care.  Iron started for anemia.  Foley catheter to be removed.  Planning in hospital circ for baby.   Morene Crocker, CNM 08/25/2016, 7:42 AM

## 2016-08-26 ENCOUNTER — Inpatient Hospital Stay (HOSPITAL_COMMUNITY)
Admission: RE | Admit: 2016-08-26 | Payer: BLUE CROSS/BLUE SHIELD | Source: Ambulatory Visit | Admitting: Obstetrics & Gynecology

## 2016-08-26 ENCOUNTER — Ambulatory Visit: Payer: Self-pay

## 2016-08-26 LAB — RPR, QUANT+TP ABS (REFLEX)
Rapid Plasma Reagin, Quant: 1:4 {titer} — ABNORMAL HIGH
T Pallidum Abs: POSITIVE — AB

## 2016-08-26 LAB — RPR: RPR Ser Ql: REACTIVE — AB

## 2016-08-26 MED ORDER — SENNOSIDES-DOCUSATE SODIUM 8.6-50 MG PO TABS: 2.0000 | ORAL_TABLET | ORAL | 2 refills | Status: DC

## 2016-08-26 MED ORDER — IBUPROFEN 600 MG PO TABS: 600.0000 mg | ORAL_TABLET | Freq: Four times a day (QID) | ORAL | 2 refills | Status: DC | PRN

## 2016-08-26 MED ORDER — OXYCODONE HCL 5 MG PO TABS: 5.0000 mg | ORAL_TABLET | ORAL | 0 refills | Status: DC | PRN

## 2016-08-26 NOTE — Discharge Summary (Signed)
OB Discharge Summary     Patient Name: Cheryl Stewart DOB: [birth year] MRN: [id number]  Date of admission: 08/24/2016 Delivering MD: Cheryl Stewart   Date of discharge: 08/26/2016  Admitting diagnosis: 37w ctx, water broke [id number] and [id number] - RCS and Fibroid Intrauterine pregnancy: [redacted]w[redacted]d     Secondary diagnosis:  Active Problems:   S/P repeat low transverse C-section  Additional problems: none     Discharge diagnosis: Term Pregnancy Delivered                                                                                                Post partum procedures:none  Augmentation: none, repeat C-section  Complications: None  Hospital course:  Sceduled C/S   31 y.o. yo [id number] at [redacted]w[redacted]d was admitted to the hospital 08/24/2016 for scheduled cesarean section with the following indication:Prior Uterine Surgery and elective repeat.  Membrane Rupture Time/Date: 4:00 PM ,08/24/2016   Patient delivered a Viable infant.08/24/2016  Details of operation can be found in separate operative note.  Pateint had an uncomplicated postpartum course.  She is ambulating, tolerating a regular diet, passing flatus, and urinating well. Patient is discharged home in stable condition on  08/26/16         Physical exam  Vitals:   08/25/16 1650 08/25/16 1945 08/25/16 2320 08/26/16 0546  BP: (!) 107/53 (!) 100/57 112/61 100/60  Pulse: 81 76 77 68  Resp: 19 20    Temp: 98.1 F (36.7 C) 98.2 F (36.8 C) 98.1 F (36.7 C) 98.2 F (36.8 C)  TempSrc: Oral Oral Oral Oral  SpO2: 98% 98%     General: alert, cooperative and no distress Lochia: appropriate Uterine Fundus: firm Incision: Healing well with no significant drainage, Dressing is clean, dry, and intact DVT Evaluation: No evidence of DVT seen on physical exam. No cords or calf tenderness. No significant calf/ankle edema. Labs: Lab Results  Component Value Date   WBC 11.6 (H) 08/25/2016   HGB 8.2 (L) 08/25/2016   HCT 25.3 (L) 08/25/2016   MCV  71.9 (L) 08/25/2016   PLT 178 08/25/2016   CMP Latest Ref Rng & Units 10/10/2009  Glucose 70 - 99 mg/dL 93  BUN 6 - 23 mg/dL 7  Creatinine 0.4 - 1.2 mg/dL 0.75  Sodium 135 - 145 mEq/L 134(L)  Potassium 3.5 - 5.1 mEq/L 3.5  Chloride 96 - 112 mEq/L 104  CO2 19 - 32 mEq/L 23  Calcium 8.4 - 10.5 mg/dL 8.7  Total Protein 6.0 - 8.3 g/dL 7.3  Total Bilirubin 0.3 - 1.2 mg/dL 0.6  Alkaline Phos 39 - 117 U/L 54  AST 0 - 37 U/L 26  ALT 0 - 35 U/L 15    Discharge instruction: per After Visit Summary and "Baby and Me Booklet".  After visit meds:  Allergies as of 08/26/2016   No Known Allergies     Medication List    STOP taking these medications   valACYclovir 1000 MG tablet Commonly known as:  VALTREX     TAKE these medications   ibuprofen 600 MG tablet Commonly known as:  ADVIL,MOTRIN  Take 1 tablet (600 mg total) by mouth every 6 (six) hours as needed for mild pain.   metroNIDAZOLE 500 MG tablet Commonly known as:  FLAGYL Take 1 tablet (500 mg total) by mouth 2 (two) times daily.   oxyCODONE 5 MG immediate release tablet Commonly known as:  Oxy IR/ROXICODONE Take 1 tablet (5 mg total) by mouth every 4 (four) hours as needed (pain scale 4-7).   senna-docusate 8.6-50 MG tablet Commonly known as:  Senokot-S Take 2 tablets by mouth daily. Start taking on:  08/27/2016       Diet: routine diet  Activity: Advance as tolerated. Pelvic rest for 6 weeks.   Outpatient follow up:4 weeks Follow up Appt:No future appointments. Follow up Visit:No Follow-up on file.  Postpartum contraception: LARK planned  Newborn Data: Live born female  Birth Weight: 7 lb 11.6 oz (3505 g) APGAR: 9, 9  Baby Feeding: Breast Disposition:home with mother   08/26/2016 Morene Crocker, CNM

## 2016-08-26 NOTE — Progress Notes (Signed)
Subjective: Postpartum Day #2: Cesarean Delivery Patient reports incisional pain, tolerating PO, + flatus and no problems voiding.    Objective: Vital signs in last 24 hours: Temp:  [98.1 F (36.7 C)-98.6 F (37 C)] 98.2 F (36.8 C) (01/26 0546) Pulse Rate:  [68-81] 68 (01/26 0546) Resp:  [18-20] 20 (01/25 1945) BP: (100-117)/(53-62) 100/60 (01/26 0546) SpO2:  [98 %] 98 % (01/25 1945)  Physical Exam:  General: alert, cooperative and no distress Lochia: appropriate Uterine Fundus: firm Incision: no significant drainage, no dehiscence, no significant erythema DVT Evaluation: No evidence of DVT seen on physical exam. No cords or calf tenderness. No significant calf/ankle edema.   Recent Labs  08/24/16 1832 08/25/16 0603  HGB 9.1* 8.2*  HCT 28.8* 25.3*    Assessment/Plan: Status post Cesarean section. Doing well postoperatively.  Discharge home with standard precautions and return to clinic in 4-6 weeks.  Morene Crocker, CNM 08/26/2016, 8:24 AM

## 2016-08-26 NOTE — Lactation Note (Addendum)
This note was copied from a baby's chart. Lactation Consultation Note  Patient Name: Cheryl Stewart [id number] Date: 08/26/2016 Reason for consult: Follow-up assessment   Follow up with mom of 11 hour old infant. Infant with 2 BF for 10-15 minutes, 5 BF attempts, 7 formula feeds of 10-30 cc, 3 voids and 2 stool in 24 hours preceding this assessment. LATCH Scores 9. Infant weight 7 lb 6.3 oz with 4% weight loss since delivery.   Mom reports BF is going well. She reports he recently fed for 10 minutes, Infant is asleep in Grandmother's arms. She feels infant is latching on well. She reports her breasts are feeling fuller today and denies nipple pain/tenderness. She declined need for [facility] services at this time. She voiced no questions/concerns at this time. Enc her to call out to desk for feeding assistance as needed.   Maternal Data Formula Feeding for Exclusion: Yes Reason for exclusion: Mother's choice to formula and breast feed on admission Does the patient have breastfeeding experience prior to this delivery?: Yes  Feeding Feeding Type: Breast Fed Length of feed: 4 min  LATCH Score/Interventions                      Lactation Tools Discussed/Used     Consult Status Consult Status: Follow-up Date: 08/27/16 Follow-up type: In-patient    Debby Freiberg Natelie Ostrosky 08/26/2016, 6:10 PM

## 2016-08-29 ENCOUNTER — Telehealth: Payer: Self-pay | Admitting: *Deleted

## 2016-08-29 NOTE — Telephone Encounter (Signed)
Pt called to office with questions about swelling after c/s. Pt states her legs/feet are swollen. Pt advised that she was given a lot of fluids while in hospital.   Pt made aware some swelling may be normal. Pt advised if she has any pitting edema to call back for appt to eval. Pt was advised to intake plenty of fluids, she needs to be up moving as much as she can tolerate and she needs to make sure she is urinating enough. Pt states she is having a hard time urinating with some burning. Pt states that she will come to office to leave urine sample in order to culture, rule out UTI. Pt advised if symptoms do not improve or get worse to call office.

## 2016-08-30 ENCOUNTER — Other Ambulatory Visit: Payer: BLUE CROSS/BLUE SHIELD

## 2016-09-01 ENCOUNTER — Encounter: Payer: Self-pay | Admitting: Obstetrics

## 2016-09-01 ENCOUNTER — Ambulatory Visit (INDEPENDENT_AMBULATORY_CARE_PROVIDER_SITE_OTHER): Payer: BLUE CROSS/BLUE SHIELD | Admitting: Obstetrics

## 2016-09-01 DIAGNOSIS — B3731 Acute candidiasis of vulva and vagina: Secondary | ICD-10-CM

## 2016-09-01 DIAGNOSIS — B373 Candidiasis of vulva and vagina: Secondary | ICD-10-CM

## 2016-09-01 DIAGNOSIS — A6004 Herpesviral vulvovaginitis: Secondary | ICD-10-CM

## 2016-09-01 MED ORDER — FLUCONAZOLE 150 MG PO TABS: 150.0000 mg | ORAL_TABLET | Freq: Once | ORAL | 2 refills | Status: AC

## 2016-09-01 MED ORDER — VALACYCLOVIR HCL 1 G PO TABS: 1000.0000 mg | ORAL_TABLET | Freq: Every day | ORAL | 11 refills | Status: DC

## 2016-09-01 NOTE — Progress Notes (Signed)
Subjective:     Cheryl Stewart is a 31 y.o. female who presents for a postpartum visit. She is 1 week postpartum following a low cervical transverse Cesarean section. I have fully reviewed the prenatal and intrapartum course. The delivery was at 38.5 gestational weeks. Outcome: repeat cesarean section, low transverse incision. Anesthesia: spinal. Postpartum course has been normal. Baby's course has been normal. Baby is feeding by breast. Bleeding thin lochia. Bowel function is normal. Bladder function is normal. Patient is not sexually active. Contraception method is abstinence. Postpartum depression screening: negative.  Tobacco, alcohol and substance abuse history reviewed.  Adult immunizations reviewed including TDAP, rubella and varicella.  The following portions of the patient's history were reviewed and updated as appropriate: allergies, current medications, past family history, past medical history, past social history, past surgical history and problem list.  Review of Systems A comprehensive review of systems was negative.   Objective:    BP 114/72   Pulse 76   Temp 98.1 F (36.7 C)   Wt 192 lb (87.1 kg)   Breastfeeding? Yes   BMI 35.12 kg/m   General:  alert and no distress   Breasts:  inspection negative, no nipple discharge or bleeding, no masses or nodularity palpable  Lungs: clear to auscultation bilaterally  Heart:  regular rate and rhythm, S1, S2 normal, no murmur, click, rub or gallop  Abdomen: soft, non-tender; bowel sounds normal; no masses,  no organomegaly and incision C, D, I.  Staples removed and steristrips applied.    50% of 20 min visit spent on counseling and coordination of care.    Assessment:    1 week postpartum.  S/P Repeat LTCS.  Doing well.  Plan:    1. Contraception: Considering options 2. Continue PNV's 3. Follow up in: 4 weeks or as needed.   Healthy lifestyle practices reviewed

## 2016-09-14 ENCOUNTER — Telehealth: Payer: Self-pay

## 2016-09-14 NOTE — Telephone Encounter (Signed)
Returned call and patient wanted refill on oxycodone due to still having pain after c section on 08-24-16. Advised patient that I spoke with Dr Jodi Mourning, and he advised her to keep taking Ibuprofen at this point for the pain. Informed patient that if pain does not get better then she should make a follow up appt to be assessed.

## 2016-09-29 ENCOUNTER — Ambulatory Visit (INDEPENDENT_AMBULATORY_CARE_PROVIDER_SITE_OTHER): Payer: BLUE CROSS/BLUE SHIELD | Admitting: Certified Nurse Midwife

## 2016-09-29 ENCOUNTER — Encounter: Payer: Self-pay | Admitting: Obstetrics

## 2016-09-29 ENCOUNTER — Other Ambulatory Visit (HOSPITAL_COMMUNITY)
Admission: RE | Admit: 2016-09-29 | Discharge: 2016-09-29 | Disposition: A | Discharge status: Home / Self Care | Payer: BLUE CROSS/BLUE SHIELD | Source: Ambulatory Visit | Attending: Obstetrics | Admitting: Obstetrics

## 2016-09-29 ENCOUNTER — Encounter: Payer: Self-pay | Admitting: *Deleted

## 2016-09-29 VITALS — BP 114/66 | HR 77 | Ht 62.0 in | Wt 183.9 lb

## 2016-09-29 DIAGNOSIS — Z3202 Encounter for pregnancy test, result negative: Secondary | ICD-10-CM

## 2016-09-29 DIAGNOSIS — R87610 Atypical squamous cells of undetermined significance on cytologic smear of cervix (ASC-US): Secondary | ICD-10-CM | POA: Diagnosis not present

## 2016-09-29 DIAGNOSIS — Z30430 Encounter for insertion of intrauterine contraceptive device: Secondary | ICD-10-CM | POA: Diagnosis not present

## 2016-09-29 DIAGNOSIS — Z01411 Encounter for gynecological examination (general) (routine) with abnormal findings: Secondary | ICD-10-CM | POA: Diagnosis not present

## 2016-09-29 DIAGNOSIS — Z124 Encounter for screening for malignant neoplasm of cervix: Secondary | ICD-10-CM

## 2016-09-29 DIAGNOSIS — Z1151 Encounter for screening for human papillomavirus (HPV): Secondary | ICD-10-CM | POA: Diagnosis not present

## 2016-09-29 LAB — POCT URINE PREGNANCY: Preg Test, Ur: NEGATIVE

## 2016-09-29 MED ORDER — LEVONORGESTREL 20 MCG/24HR IU IUD
INTRAUTERINE_SYSTEM | Freq: Once | INTRAUTERINE | Status: AC
  Administered 2016-09-29: 16:00:00 via INTRAUTERINE

## 2016-09-29 NOTE — Progress Notes (Signed)
IUD Procedure Note   DIAGNOSIS: Desires long-term, reversible contraception   PROCEDURE: IUD placement Performing Provider: Kandis Cocking CNM  Patient counseled prior to procedure. I explained risks and benefits of Mirena IUD, reviewed alternative forms of contraception. Patient stated understanding and consented to continue with procedure.   LMP: unknown; postpartum, abstinence and breast feeding Pregnancy Test: Negative Lot #: [id number] Expiration Date: 12/2018   IUD type: [X]  Mirena   [   ] Paragard  [  ] Diamantina Monks   [  ]  Kyleena  PROCEDURE:  Timeout procedure was performed to ensure right patient and right site.  A bimanual exam was performed to determine the position of the uterus, retroflexed. The speculum was placed. The vagina and cervix was sterilized in the usual manner and sterile technique was maintained throughout the course of the procedure. A single toothed tenaculum was applied to the posterior lip of the cervix and gentle traction applied. The depth of the uterus was sounded to 10 cm. With gentle traction on the tenaculum, the IUD was inserted to the appropriate depth and inserted without difficulty.  The string was cut to an estimated 4 cm length. Bleeding was minimal. The patient tolerated the procedure well.   Follow up: The patient tolerated the procedure well without complications.  Standard post-procedure care is explained and return precautions are given.  Kandis Cocking CNM

## 2016-09-29 NOTE — Progress Notes (Signed)
Post Partum Exam  Cheryl Stewart is a 31 y.o. [phone] female who presents for a postpartum visit. She is 6 weeks postpartum following a low cervical transverse Cesarean section. I have fully reviewed the prenatal and intrapartum course. The delivery was at [redacted]w[redacted]d gestational weeks.  Anesthesia: spinal. Postpartum course has been unremarkable. Baby's course has been unremarkable. Baby is feeding by breast. Bleeding no bleeding. Bowel function is normal. Bladder function is normal. Patient is not sexually active. Contraception method is none. Postpartum depression screening:neg. EPDS: 0  The following portions of the patient's history were reviewed and updated as appropriate: allergies, current medications, past family history, past medical history, past social history, past surgical history and problem list.  Review of Systems Pertinent items noted in HPI and remainder of comprehensive ROS otherwise negative.    Objective:  currently breastfeeding.  General:  alert, cooperative and no distress   Breasts:  inspection negative, no nipple discharge or bleeding, no masses or nodularity palpable  Lungs: clear to auscultation bilaterally  Heart:  regular rate and rhythm, S1, S2 normal, no murmur, click, rub or gallop  Abdomen: soft, non-tender; bowel sounds normal; no masses,  no organomegaly   Vulva:  normal  Vagina: normal vagina, no discharge, exudate, lesion, or erythema  Cervix:  no bleeding following Pap and no cervical motion tenderness  Corpus: normal size, contour, position, consistency, mobility, non-tender  Adnexa:  normal adnexa  Rectal Exam: Not performed.        Assessment:    Normal 6 week postpartum exam. Pap smear done at today's visit.   Plan:   1. Contraception: IUD 2. See IUD placement note.  3. Follow up in: 1 month or as needed.

## 2016-10-04 LAB — CYTOLOGY - PAP
Diagnosis: UNDETERMINED — AB
HPV: NOT DETECTED

## 2016-11-01 ENCOUNTER — Ambulatory Visit: Payer: BLUE CROSS/BLUE SHIELD | Admitting: Obstetrics

## 2016-11-09 ENCOUNTER — Ambulatory Visit: Payer: BLUE CROSS/BLUE SHIELD | Admitting: Obstetrics

## 2017-01-06 NOTE — Addendum Note (Signed)
Addendum  created 01/06/17 0930 by Lyn Hollingshead, MD   Sign clinical note

## 2017-01-13 ENCOUNTER — Ambulatory Visit (INDEPENDENT_AMBULATORY_CARE_PROVIDER_SITE_OTHER): Payer: BLUE CROSS/BLUE SHIELD | Admitting: Obstetrics

## 2017-01-13 ENCOUNTER — Encounter: Payer: Self-pay | Admitting: Obstetrics

## 2017-01-13 ENCOUNTER — Ambulatory Visit: Payer: BLUE CROSS/BLUE SHIELD | Admitting: Obstetrics

## 2017-01-13 DIAGNOSIS — Z3201 Encounter for pregnancy test, result positive: Secondary | ICD-10-CM | POA: Diagnosis not present

## 2017-01-13 DIAGNOSIS — N939 Abnormal uterine and vaginal bleeding, unspecified: Secondary | ICD-10-CM

## 2017-01-13 DIAGNOSIS — Z975 Presence of (intrauterine) contraceptive device: Secondary | ICD-10-CM | POA: Diagnosis not present

## 2017-01-13 DIAGNOSIS — Z113 Encounter for screening for infections with a predominantly sexual mode of transmission: Secondary | ICD-10-CM

## 2017-01-13 DIAGNOSIS — Z30431 Encounter for routine checking of intrauterine contraceptive device: Secondary | ICD-10-CM | POA: Diagnosis not present

## 2017-01-13 DIAGNOSIS — D509 Iron deficiency anemia, unspecified: Secondary | ICD-10-CM

## 2017-01-13 LAB — POCT URINE PREGNANCY: Preg Test, Ur: POSITIVE — AB

## 2017-01-13 MED ORDER — PRENATAL PLUS 27-1 MG PO TABS: 1.0000 | ORAL_TABLET | Freq: Every day | ORAL | 4 refills | Status: DC

## 2017-01-13 MED ORDER — FERROUS SULFATE 325 (65 FE) MG PO TABS: 325.0000 mg | ORAL_TABLET | Freq: Two times a day (BID) | ORAL | 11 refills | Status: DC

## 2017-01-13 NOTE — Progress Notes (Signed)
Patient is in the office for 1 month string check. Patient states she has been sick lately and she decided to do a pregnancy test. She states her test was positive. Patient had her IUD placed at her 6 week check up in March and she did not follow up. She has been having heavy bleeding.

## 2017-01-13 NOTE — Progress Notes (Signed)
Subjective:    Cheryl Stewart  who presents for contraception surveillance of Mirena IUD.  She has had heavy vaginal bleeding after IUD insertion. The patient has had nausea, fatigue and breast tenderness.  She decided to do a home pregnancy test, and it was positive. The patient is sexually active. Pertinent past medical history: none.  Likes her Mirena IUD.  Is not currently bleeding with the IUD.    The information documented in the HPI was reviewed and verified.  Menstrual History: OB History    Gravida Para Term Preterm AB Living   1 1 1     1    SAB TAB Ectopic Multiple Live Births         0 1      No LMP recorded. has Mirena IUD   Patient Active Problem List   Diagnosis Date Noted   Patient Active Problem List   Diagnosis Date Noted  . Encounter for IUD insertion 09/29/2016  . S/P repeat low transverse C-section 08/25/2016  . 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 Uterine fibroids affecting pregnancy in second trimester, antepartum 03/14/2016    No past medical history on file.   Past Surgical History:  Procedure Laterality Date   Past Surgical History:  Procedure Laterality Date  . CESAREAN SECTION  2009  . CESAREAN SECTION N/A 04/22/2014   Procedure: CESAREAN SECTION;  Surgeon: Frederico Hamman, MD;  Location: [facility];  Service: Obstetrics;  Laterality: N/A;  . CESAREAN SECTION N/A 08/24/2016   Procedure: CESAREAN SECTION;  Surgeon: Florian Buff, MD;  Location: [place];  Service: Obstetrics;  Laterality: N/A;  . COLPOSCOPY  01/2011      Current Outpatient Prescriptions:  No medication comments found. No current outpatient prescriptions on file prior to visit.   No current facility-administered medications on file prior to visit.     Allergies  Allergen Reactions  No Known Allergies   Social History  Substance Use Topics   Social History   Social History  . Marital status: Single    Spouse name: N/A  . Number of children: N/A  .  Years of education: N/A   Occupational History  . Not on file.   Social History Main Topics  . Smoking status: Never Smoker  . Smokeless tobacco: Never Used  . Alcohol use No  . Drug use: No  . Sexual activity: Yes    Partners: Male    Birth control/ protection: IUD   Other Topics Concern  . Not on file   Social History Narrative  . No narrative on file     No family history on file.     Review of Systems Constitutional: negative for weight loss Genitourinary:positive for abnormal menstrual vaginal bleeding   Objective:   BP 115/77   Pulse 89   Wt 161 lb (73 kg)   BMI 25.22 kg/m    General:   alert  Skin:   no rash or abnormalities  Lungs:   clear to auscultation bilaterally  Heart:   regular rate and rhythm, S1, S2 normal, no murmur, click, rub or gallop  Breasts:   deferred  Abdomen:  normal findings: no organomegaly, soft, non-tender and no hernia  Pelvis:  External genitalia: normal general appearance Urinary system: urethral meatus normal and bladder without fullness, nontender Vaginal: normal without tenderness, induration or masses Cervix: normal appearance, IUD strings present.  Tried to remove the IUD but it did not come with gentle pull of string with dressing  forceps Adnexa: normal bimanual exam Uterus: anteverted and non-tender, normal size   Lab Review Urine pregnancy test Labs reviewed yes Radiologic studies reviewed no  50% of 15 min visit spent on counseling and coordination of care.    Assessment:    31 y.o., continuing IUD for now.  Has positive pregnancy test.  Stable.    1. IUD check up Rx: - POCT urine pregnancy - Cervicovaginal ancillary only  2. Pregnancy test positive Rx: - prenatal vitamin w/FE, FA (PRENATAL 1 + 1) 27-1 MG TABS tablet; Take 1 tablet by mouth daily before breakfast.  Dispense: 90 each; Refill: 4  3. IUD (intrauterine device) in place and is pregnant - patient to consider options   4. Iron deficiency  anemia, unspecified iron deficiency anemia type Rx: - ferrous sulfate 325 (65 FE) MG tablet; Take 1 tablet (325 mg total) by mouth 2 (two) times daily with a meal.  Dispense: 60 tablet; Refill: 11  5. Abnormal uterine bleeding (AUB).  Resolved.  Plan:    Discussed ramifications of being pregnant with IUD in place.  All questions answered.   Patient will consider options  Follow up as needed    Patient ID: Cheryl Stewart, female   DOB: 07-Nov-1985, 31 y.o.   MRN: [id number]

## 2017-01-16 ENCOUNTER — Telehealth: Payer: Self-pay | Admitting: *Deleted

## 2017-01-16 ENCOUNTER — Other Ambulatory Visit: Payer: Self-pay | Admitting: Obstetrics

## 2017-01-16 DIAGNOSIS — B9689 Other specified bacterial agents as the cause of diseases classified elsewhere: Secondary | ICD-10-CM

## 2017-01-16 DIAGNOSIS — N760 Acute vaginitis: Principal | ICD-10-CM

## 2017-01-16 LAB — CERVICOVAGINAL ANCILLARY ONLY
Bacterial vaginitis: POSITIVE — AB
Candida vaginitis: NEGATIVE
Chlamydia: NEGATIVE
Neisseria Gonorrhea: NEGATIVE
Trichomonas: NEGATIVE

## 2017-01-16 MED ORDER — METRONIDAZOLE 500 MG PO TABS: 500.0000 mg | ORAL_TABLET | Freq: Two times a day (BID) | ORAL | 2 refills | Status: DC

## 2017-01-16 NOTE — Addendum Note (Signed)
Addended by: Baltazar Najjar A on: 01/16/2017 02:23 PM   Modules accepted: Orders

## 2017-01-16 NOTE — Telephone Encounter (Signed)
Pt states she was seen in office on Friday for IUD check with Dr Jodi Mourning. Pt states she had positive pregnancy test. Pt states that Dr Jodi Mourning reviewed pregnancy with and IUD and options she may consider. Pt states that she was advised that she may get a second opinion if she would like. Pt states that she has called other OB/GYN offices and states they will not see her that she should return to her regular OB provider.  Pt would like to know if an ultrasound could be ordered to determine place of IUD and pregnancy dates or if she should be scheduled with another provider in our office. Pt made aware would be discussed with provider for possible order.  Discussed with Dr Jodi Mourning, he will order u/s to determine pregnancy and IUD placement.  Call placed to pt to make her aware that u/s has been ordered and she is scheduled for 01/26/17, to arrive at 145 with full bladder. Pt made aware recommendations could then be made regarding pregnancy after u/s has been resulted.  Pt states understanding.

## 2017-01-18 ENCOUNTER — Telehealth: Payer: Self-pay

## 2017-01-18 NOTE — Telephone Encounter (Signed)
S/w pt and advised of results and rx sent by provider. 

## 2017-01-26 ENCOUNTER — Telehealth: Payer: Self-pay | Admitting: *Deleted

## 2017-01-26 ENCOUNTER — Ambulatory Visit (HOSPITAL_COMMUNITY)
Admission: RE | Admit: 2017-01-26 | Discharge: 2017-01-26 | Disposition: A | Discharge status: Home / Self Care | Payer: BLUE CROSS/BLUE SHIELD | Source: Ambulatory Visit | Attending: Obstetrics | Admitting: Obstetrics

## 2017-01-26 ENCOUNTER — Other Ambulatory Visit: Payer: Self-pay | Admitting: Obstetrics

## 2017-01-26 DIAGNOSIS — O208 Other hemorrhage in early pregnancy: Secondary | ICD-10-CM | POA: Diagnosis not present

## 2017-01-26 DIAGNOSIS — Z975 Presence of (intrauterine) contraceptive device: Secondary | ICD-10-CM | POA: Diagnosis not present

## 2017-01-26 DIAGNOSIS — Z3201 Encounter for pregnancy test, result positive: Secondary | ICD-10-CM

## 2017-01-26 DIAGNOSIS — Z3A09 9 weeks gestation of pregnancy: Secondary | ICD-10-CM | POA: Diagnosis not present

## 2017-01-26 DIAGNOSIS — O3411 Maternal care for benign tumor of corpus uteri, first trimester: Secondary | ICD-10-CM | POA: Insufficient documentation

## 2017-01-26 DIAGNOSIS — D259 Leiomyoma of uterus, unspecified: Secondary | ICD-10-CM | POA: Insufficient documentation

## 2017-01-26 NOTE — Telephone Encounter (Signed)
Spoke with Radiology at [facility]. They states order for pt scan today is incorrect. Pt has had +UPT and will need OB US orderes. Verbal given to [employer] in order to complete US today.

## 2017-02-14 ENCOUNTER — Ambulatory Visit (INDEPENDENT_AMBULATORY_CARE_PROVIDER_SITE_OTHER): Payer: BLUE CROSS/BLUE SHIELD | Admitting: Obstetrics

## 2017-02-14 ENCOUNTER — Encounter: Payer: Self-pay | Admitting: Obstetrics

## 2017-02-14 VITALS — BP 120/75 | HR 85 | Ht 62.0 in | Wt 195.6 lb

## 2017-02-14 DIAGNOSIS — B3731 Acute candidiasis of vulva and vagina: Secondary | ICD-10-CM

## 2017-02-14 DIAGNOSIS — Z331 Pregnant state, incidental: Secondary | ICD-10-CM | POA: Diagnosis not present

## 2017-02-14 DIAGNOSIS — Z975 Presence of (intrauterine) contraceptive device: Secondary | ICD-10-CM

## 2017-02-14 DIAGNOSIS — O99019 Anemia complicating pregnancy, unspecified trimester: Secondary | ICD-10-CM

## 2017-02-14 DIAGNOSIS — O2631 Retained intrauterine contraceptive device in pregnancy, first trimester: Secondary | ICD-10-CM | POA: Diagnosis not present

## 2017-02-14 DIAGNOSIS — B373 Candidiasis of vulva and vagina: Secondary | ICD-10-CM

## 2017-02-14 DIAGNOSIS — Z3480 Encounter for supervision of other normal pregnancy, unspecified trimester: Secondary | ICD-10-CM

## 2017-02-14 MED ORDER — TERCONAZOLE 0.8 % VA CREA: 1.0000 | TOPICAL_CREAM | Freq: Every day | VAGINAL | 0 refills | Status: DC

## 2017-02-14 MED ORDER — IROSPAN 24/6 PO MISC: 1.0000 | Freq: Every day | ORAL | 5 refills | Status: DC

## 2017-02-14 NOTE — Progress Notes (Signed)
Patient ID: Cheryl Stewart, female   DOB: Dec 26, 1985, 31 y.o.   MRN: [id number]  Chief Complaint  Patient presents with  . Follow-up    HPI Cheryl Stewart is a 31 y.o. female.  History of early pregnancy with IUD in place.  Unable to remove IUD.  Presents for ultrasound results.  No complaints. HPI  Past Medical History:  Diagnosis Date  . Genital herpes   . LGSIL (low grade squamous intraepithelial dysplasia) 07/12  . Vaginal Pap smear, abnormal     Past Surgical History:  Procedure Laterality Date  . CESAREAN SECTION  2009  . CESAREAN SECTION N/A 04/22/2014   Procedure: CESAREAN SECTION;  Surgeon: Frederico Hamman, MD;  Location: [facility];  Service: Obstetrics;  Laterality: N/A;  . CESAREAN SECTION N/A 08/24/2016   Procedure: CESAREAN SECTION;  Surgeon: Florian Buff, MD;  Location: [place];  Service: Obstetrics;  Laterality: N/A;  . COLPOSCOPY  01/2011    Family History  Problem Relation Age of Onset  . Hypertension Father     Social History Social History  Substance Use Topics  . Smoking status: Never Smoker  . Smokeless tobacco: Never Used  . Alcohol use No    No Known Allergies  Current Outpatient Prescriptions  Medication Sig Dispense Refill  . ferrous sulfate 325 (65 FE) MG tablet Take 1 tablet (325 mg total) by mouth 2 (two) times daily with a meal. 60 tablet 11  . prenatal vitamin w/FE, FA (PRENATAL 1 + 1) 27-1 MG TABS tablet Take 1 tablet by mouth daily before breakfast. 90 each 4  . Fe-Succ Ac-B Cmplx-C-Ca-FA (IROSPAN 24/6) MISC Take 1 tablet by mouth daily before breakfast. 30 each 5  . metroNIDAZOLE (FLAGYL) 500 MG tablet Take 1 tablet (500 mg total) by mouth 2 (two) times daily. (Patient not taking: Reported on 02/14/2017) 14 tablet 2  . terconazole (TERAZOL 3) 0.8 % vaginal cream Place 1 applicator vaginally at bedtime. 20 g 0   No current facility-administered medications for this visit.     Review of Systems Review of Systems Constitutional:  negative for fatigue and weight loss Respiratory: negative for cough and wheezing Cardiovascular: negative for chest pain, fatigue and palpitations Gastrointestinal: negative for abdominal pain and change in bowel habits Genitourinary:negative Integument/breast: negative for nipple discharge Musculoskeletal:negative for myalgias Neurological: negative for gait problems and tremors Behavioral/Psych: negative for abusive relationship, depression Endocrine: negative for temperature intolerance      Blood pressure 120/75, pulse 85, height 5\' 2"  (1.575 m), weight 195 lb 9.6 oz (88.7 kg), last menstrual period 12/08/2016, not currently breastfeeding.  Physical Exam Physical Exam:  Deferred  50% of 10 min visit spent on counseling and coordination of care.    Data Reviewed Ultrasound:      US OB Comp Less 14 Wks (Accession [id number]) (Order [id number])  Imaging  Date: 01/26/2017 Department: [place] Released By: Jill Poling, RT Authorizing: Shelly Bombard, MD  Exam Information   Status Exam Begun  Exam Ended   Final [99] 01/26/2017 2:15 PM 01/26/2017 3:11 PM  PACS Images   Show images for US OB Comp Less 14 Wks  Study Result   CLINICAL DATA:  Positive pregnancy test. IUD in place. Unknown LMP.  EXAM: OBSTETRIC <14 WK [place] AND TRANSVAGINAL OB US  TECHNIQUE: Both transabdominal and transvaginal ultrasound examinations were performed for complete evaluation of the gestation as well as the maternal uterus, adnexal regions, and pelvic cul-de-sac. Transvaginal technique  was performed to assess early pregnancy.  COMPARISON:  None.  FINDINGS: Intrauterine gestational sac: Single  Yolk sac:  Visualized.  Embryo:  Visualized.  Cardiac Activity: Visualized.  Heart Rate: 159  bpm  CRL:  31  mm   9 w   6 d                  [place] EDC: 08/25/2017  Subchorionic hemorrhage: Small subchorionic hemorrhage noted in the lower uterine  segment.  Maternal uterus/adnexae: An IUD is visualized in the lower uterine segment below the level of the IUP. A small calcified fibroid is seen in the anterior uterine corpus which measures 3.9 cm in maximum diameter both ovaries are normal in appearance. No adnexal mass or free fluid identified.  IMPRESSION: Single living IUP measuring 9 weeks 6 days, with [place] EDC of 08/25/2017.  IUD and small subchorionic hemorrhage in lower uterine segment, below the level of the IUP.  3.9 cm anterior calcified uterine fibroid.   Electronically Signed   By: Earle Gell M.D.   On: 01/27/2017 08:14   Vitals   Height Weight BMI (Calculated)  5\' 2"  (1.575 m) 195 lb 9.6 oz (88.7 kg) 35.9  External Result Report   External Result Report  Imaging   Imaging Information  Signed by   Signed Date/Time  Phone Pager  Earle Gell 01/27/2017 8:14 AM [phone]  Signed   Electronically signed by Earle Gell, MD on 01/27/17 at [phone] EDT  Original Order   Ordered On Ordered By   01/16/2017 2:23 PM Jill Poling, RT            Assessment     ~13 week pregnancy.  IUD in place, and removal unsuccessful.  Ultrasound shows IUD in LUS below gestational sac.  Findings discussed with patient.     Will leave in place, and patient agrees.  Plan    Follow up in 3 weeks for NOB appointment.    Orders Placed This Encounter  Procedures  . Ambulatory referral to Hematology / Oncology    Referral Priority:   Routine    Referral Type:   Consultation    Referral Reason:   Specialty Services Required    Number of Visits Requested:   1   Meds ordered this encounter  Medications  . Fe-Succ Ac-B Cmplx-C-Ca-FA (IROSPAN 24/6) MISC    Sig: Take 1 tablet by mouth daily before breakfast.    Dispense:  30 each    Refill:  5  . terconazole (TERAZOL 3) 0.8 % vaginal cream    Sig: Place 1 applicator vaginally at bedtime.    Dispense:  20 g    Refill:  0

## 2017-02-14 NOTE — Progress Notes (Signed)
Patient is in the office to go over U/S results.

## 2017-02-20 ENCOUNTER — Telehealth: Payer: Self-pay | Admitting: Hematology and Oncology

## 2017-02-20 NOTE — Telephone Encounter (Signed)
Appt has been scheduled for the pt to see Dr. Lebron Conners on 7/27 at 11am. Pt aware to arrive 30 minutes early.

## 2017-02-21 ENCOUNTER — Encounter: Payer: Self-pay | Admitting: Hematology and Oncology

## 2017-02-21 ENCOUNTER — Telehealth: Payer: Self-pay | Admitting: Hematology and Oncology

## 2017-02-21 NOTE — Telephone Encounter (Signed)
Lft a vm to reschedule the appt

## 2017-02-21 NOTE — Telephone Encounter (Signed)
Appt has been rescheduled for the pt to see Dr. Dwain Sarna on 8/1 at 2pm. Pt aware to arrive 30 minutes early. Letter mailed with new appt date and time.

## 2017-02-24 ENCOUNTER — Encounter: Payer: BLUE CROSS/BLUE SHIELD | Admitting: Hematology and Oncology

## 2017-03-01 ENCOUNTER — Ambulatory Visit (HOSPITAL_BASED_OUTPATIENT_CLINIC_OR_DEPARTMENT_OTHER): Payer: BLUE CROSS/BLUE SHIELD

## 2017-03-01 ENCOUNTER — Ambulatory Visit (HOSPITAL_BASED_OUTPATIENT_CLINIC_OR_DEPARTMENT_OTHER): Payer: BLUE CROSS/BLUE SHIELD | Admitting: Hematology and Oncology

## 2017-03-01 ENCOUNTER — Telehealth: Payer: Self-pay | Admitting: Hematology and Oncology

## 2017-03-01 VITALS — BP 109/50 | HR 80 | Temp 98.6°F | Resp 18 | Ht 62.0 in | Wt 199.1 lb

## 2017-03-01 DIAGNOSIS — O99012 Anemia complicating pregnancy, second trimester: Secondary | ICD-10-CM

## 2017-03-01 DIAGNOSIS — O99011 Anemia complicating pregnancy, first trimester: Secondary | ICD-10-CM

## 2017-03-01 LAB — CBC & DIFF AND RETIC
BASO%: 0.2 % (ref 0.0–2.0)
Basophils Absolute: 0 10*3/uL (ref 0.0–0.1)
EOS%: 0.9 % (ref 0.0–7.0)
Eosinophils Absolute: 0.1 10*3/uL (ref 0.0–0.5)
HCT: 38.9 % (ref 34.8–46.6)
HGB: 13.2 g/dL (ref 11.6–15.9)
Immature Retic Fract: 11.1 % — ABNORMAL HIGH (ref 1.60–10.00)
LYMPH%: 23.3 % (ref 14.0–49.7)
MCH: 27.6 pg (ref 25.1–34.0)
MCHC: 33.9 g/dL (ref 31.5–36.0)
MCV: 81.4 fL (ref 79.5–101.0)
MONO#: 0.4 10*3/uL (ref 0.1–0.9)
MONO%: 6.8 % (ref 0.0–14.0)
NEUT#: 4.4 10*3/uL (ref 1.5–6.5)
NEUT%: 68.8 % (ref 38.4–76.8)
Platelets: 163 10*3/uL (ref 145–400)
RBC: 4.78 10*6/uL (ref 3.70–5.45)
RDW: 19.3 % — ABNORMAL HIGH (ref 11.2–14.5)
Retic %: 2.73 % — ABNORMAL HIGH (ref 0.70–2.10)
Retic Ct Abs: 130.49 10*3/uL — ABNORMAL HIGH (ref 33.70–90.70)
WBC: 6.3 10*3/uL (ref 3.9–10.3)
lymph#: 1.5 10*3/uL (ref 0.9–3.3)

## 2017-03-01 MED ORDER — FERROUS GLUCONATE 324 (38 FE) MG PO TABS: 324.0000 mg | ORAL_TABLET | Freq: Two times a day (BID) | ORAL | 3 refills | Status: DC

## 2017-03-01 NOTE — Telephone Encounter (Signed)
Scheduled appt per 8/1 los - Gave patient AVS and calender per los.  

## 2017-03-01 NOTE — Progress Notes (Signed)
[facility] New Visit:  Assessment: Anemia complicating pregnancy, second trimester 31 year old multiparous female with chronic anemia, currently nearly [redacted] weeks pregnant with her fourth pregnancy which followed the third one within less than 6 months. Nature of anemia is likely due to iron deficiency due to recurrent pregnancies obliquing the stores of the patient's body as well as possible bleeding in between the pregnancies not adequately replaced by diet alone.  Unfortunately, patient was referred to [place] without additional documentation to demonstrate the blood counts and iron levels between January 2018 and now. At the mind, we will obtain repeat lab work today to reassess the iron stores and hemoglobin level.  Patient appears to be able to take oral iron, if her hemoglobin is recovering, no need for parenteral iron is at this time. If patient is experiencing difficulties taking oral iron in the current form of iron sulfate, I am suggesting her to try and take ferrous gluconate as a potential alternative.  Plan: --Labs today --Change from ferrous sulfate to ferrous gluconate -- both belong to the same category of risk in terms of pregnancy outcomes --RTC with me in one week to review the lab results  Voice recognition software was used and creation of this note. Despite my best effort at editing the text, some misspelling/errors may have occurred. --   Orders Placed This Encounter  Procedures  . CBC & Diff and Retic    Standing Status:   Future    Number of Occurrences:   1    Standing Expiration Date:   03/01/2018  . Hemoglobinopathy evaluation    Standing Status:   Future    Number of Occurrences:   1    Standing Expiration Date:   03/01/2018  . Ferritin    Standing Status:   Future    Number of Occurrences:   1    Standing Expiration Date:   03/01/2018  . Iron and TIBC    Standing Status:   Future    Number of Occurrences:   1    Standing Expiration Date:    03/01/2018  . Folate, Serum    Standing Status:   Future    Number of Occurrences:   1    Standing Expiration Date:   03/01/2018  . Vitamin B12    Standing Status:   Future    Number of Occurrences:   1    Standing Expiration Date:   03/01/2018  . CBC & Diff and Retic    Standing Status:   Future    Standing Expiration Date:   03/01/2018  . Ferritin    Standing Status:   Future    Standing Expiration Date:   03/01/2018  . Iron and TIBC    Standing Status:   Future    Standing Expiration Date:   03/01/2018  . Comprehensive metabolic panel    Standing Status:   Future    Standing Expiration Date:   03/01/2018    All questions were answered.  . The patient knows to call the clinic with any problems, questions or concerns.  This note was electronically signed.    History of Presenting Illness Cheryl Stewart 31 y.o. presenting to the [facility] for Evaluation of anemia complicating the fourth pregnancy. Patient is approximately [redacted] weeks pregnant at this time. Discomfort pregnancy is complicated by retention of the intrauterine that was impossible to remove. Device is currently located below the right sac. No recent lab values submitted with the available  referral documents. According to the patient, her labs were checked in approximately 2 weeks ago. She was informed that she has anemia and that she is iron deficient. She was receiving prenatal iron-containing vitamins, she was taken off of her previously prescribed iron sulfate. She did have difficulties tolerating aren't sulfate and the prenatal vitamin with symptoms of indigestion and nausea. She has not been very adherent to the treatments according to her. It was suggested to her that she may be candidate for parenteral iron supplementation.  At this time, patient denies any active complaints.  Oncological/hematological History: --Labs, 06/10/16: WBC 6.2, Hgb 9.9, MCV 75.0, MCH 24.1, RDW 19.2, plt 176;  --Labs, 08/25/16: WBC 11.6, Hgb 8.2,  MCV 71.9, MCH 23.3, RDW 17.8, Plt 178   Medical History: Past Medical History:  Diagnosis Date  . Genital herpes   . LGSIL (low grade squamous intraepithelial dysplasia) 07/12  . Vaginal Pap smear, abnormal     Surgical History: Past Surgical History:  Procedure Laterality Date  . CESAREAN SECTION  2009  . CESAREAN SECTION N/A 04/22/2014   Procedure: CESAREAN SECTION;  Surgeon: Frederico Hamman, MD;  Location: [facility];  Service: Obstetrics;  Laterality: N/A;  . CESAREAN SECTION N/A 08/24/2016   Procedure: CESAREAN SECTION;  Surgeon: Florian Buff, MD;  Location: [place];  Service: Obstetrics;  Laterality: N/A;  . COLPOSCOPY  01/2011    Family History: Family History  Problem Relation Age of Onset  . Hypertension Father     Social History: Social History   Social History  . Marital status: Single    Spouse name: N/A  . Number of children: N/A  . Years of education: N/A   Occupational History  . Not on file.   Social History Main Topics  . Smoking status: Never Smoker  . Smokeless tobacco: Never Used  . Alcohol use No  . Drug use: No  . Sexual activity: Yes    Partners: Male    Birth control/ protection: IUD   Other Topics Concern  . Not on file   Social History Narrative  . No narrative on file    Allergies: No Known Allergies  Medications:  Current Outpatient Prescriptions  Medication Sig Dispense Refill  . Fe-Succ Ac-B Cmplx-C-Ca-FA (IROSPAN 24/6) MISC Take 1 tablet by mouth daily before breakfast. 30 each 5  . ferrous gluconate (FERGON) 324 MG tablet Take 1 tablet (324 mg total) by mouth 2 (two) times daily with a meal. 60 tablet 3  . metroNIDAZOLE (FLAGYL) 500 MG tablet Take 1 tablet (500 mg total) by mouth 2 (two) times daily. (Patient not taking: Reported on 02/14/2017) 14 tablet 2  . prenatal vitamin w/FE, FA (PRENATAL 1 + 1) 27-1 MG TABS tablet Take 1 tablet by mouth daily before breakfast. 90 each 4  . terconazole (TERAZOL 3) 0.8 %  vaginal cream Place 1 applicator vaginally at bedtime. 20 g 0   No current facility-administered medications for this visit.     Review of Systems: Review of Systems  All other systems reviewed and are negative.    PHYSICAL EXAMINATION Blood pressure (!) 109/50, pulse 80, temperature 98.6 F (37 C), temperature source Oral, resp. rate 18, height 5\' 2"  (1.575 m), weight 199 lb 1.6 oz (90.3 kg), last menstrual period 12/08/2016, SpO2 100 %, not currently breastfeeding.  ECOG PERFORMANCE STATUS: 0 - Asymptomatic  Physical Exam  Constitutional: She is oriented to person, place, and time and well-developed, well-nourished, and in no distress. No distress.  HENT:  Head: Normocephalic.  Mouth/Throat: Oropharynx is clear and moist. No oropharyngeal exudate.  Eyes: Pupils are equal, round, and reactive to light. Conjunctivae and EOM are normal. No scleral icterus.  Neck: Normal range of motion. No thyromegaly present.  Cardiovascular: Normal rate, regular rhythm and normal heart sounds.   No murmur heard. Pulmonary/Chest: Effort normal and breath sounds normal. No respiratory distress. She has no wheezes.  Abdominal: Soft. She exhibits no distension and no mass. There is no tenderness. There is no rebound.  Musculoskeletal: Normal range of motion. She exhibits no edema.  Lymphadenopathy:    She has no cervical adenopathy.  Neurological: She is alert and oriented to person, place, and time. She has normal reflexes.  Skin: Skin is warm and dry. No rash noted. She is not diaphoretic. No erythema. No pallor.     LABORATORY DATA: I have personally reviewed the data as listed: Appointment on 03/01/2017  Component Date Value Ref Range Status  . WBC 03/01/2017 6.3  3.9 - 10.3 10e3/uL Final  . NEUT# 03/01/2017 4.4  1.5 - 6.5 10e3/uL Final  . HGB 03/01/2017 13.2  11.6 - 15.9 g/dL Final  . HCT 03/01/2017 38.9  34.8 - 46.6 % Final  . Platelets 03/01/2017 163  145 - 400 10e3/uL Final  . MCV  03/01/2017 81.4  79.5 - 101.0 fL Final  . MCH 03/01/2017 27.6  25.1 - 34.0 pg Final  . MCHC 03/01/2017 33.9  31.5 - 36.0 g/dL Final  . RBC 03/01/2017 4.78  3.70 - 5.45 10e6/uL Final  . RDW 03/01/2017 19.3* 11.2 - 14.5 % Final  . lymph# 03/01/2017 1.5  0.9 - 3.3 10e3/uL Final  . MONO# 03/01/2017 0.4  0.1 - 0.9 10e3/uL Final  . Eosinophils Absolute 03/01/2017 0.1  0.0 - 0.5 10e3/uL Final  . Basophils Absolute 03/01/2017 0.0  0.0 - 0.1 10e3/uL Final  . NEUT% 03/01/2017 68.8  38.4 - 76.8 % Final  . LYMPH% 03/01/2017 23.3  14.0 - 49.7 % Final  . MONO% 03/01/2017 6.8  0.0 - 14.0 % Final  . EOS% 03/01/2017 0.9  0.0 - 7.0 % Final  . BASO% 03/01/2017 0.2  0.0 - 2.0 % Final  . Retic % 03/01/2017 2.73* 0.70 - 2.10 % Final  . Retic Ct Abs 03/01/2017 130.49* 33.70 - 90.70 10e3/uL Final  . Immature Retic Fract 03/01/2017 11.10* 1.60 - 10.00 % Final         Ardath Sax, MD

## 2017-03-01 NOTE — Assessment & Plan Note (Signed)
31 year old multiparous female with chronic anemia, currently nearly [redacted] weeks pregnant with her fourth pregnancy which followed the third one within less than 6 months. Nature of anemia is likely due to iron deficiency due to recurrent pregnancies obliquing the stores of the patient's body as well as possible bleeding in between the pregnancies not adequately replaced by diet alone.  Unfortunately, patient was referred to [place] without additional documentation to demonstrate the blood counts and iron levels between January 2018 and now. At the mind, we will obtain repeat lab work today to reassess the iron stores and hemoglobin level.  Patient appears to be able to take oral iron, if her hemoglobin is recovering, no need for parenteral iron is at this time. If patient is experiencing difficulties taking oral iron in the current form of iron sulfate, I am suggesting her to try and take ferrous gluconate as a potential alternative.  Plan: --Labs today --Change from ferrous sulfate to ferrous gluconate -- both belong to the same category of risk in terms of pregnancy outcomes --RTC with me in one week to review the lab results  Voice recognition software was used and creation of this note. Despite my best effort at editing the text, some misspelling/errors may have occurred. --

## 2017-03-02 LAB — FERRITIN: Ferritin: 36 ng/ml (ref 9–269)

## 2017-03-02 LAB — VITAMIN B12: Vitamin B12: 530 pg/mL (ref 232–1245)

## 2017-03-02 LAB — IRON AND TIBC
%SAT: 26 % (ref 21–57)
Iron: 91 ug/dL (ref 41–142)
TIBC: 348 ug/dL (ref 236–444)
UIBC: 257 ug/dL (ref 120–384)

## 2017-03-02 LAB — FOLATE: Folate: 17.2 ng/mL (ref >3.0)

## 2017-03-03 LAB — HEMOGLOBINOPATHY EVALUATION
HGB C: 0 %
HGB S: 0 %
HGB VARIANT: 0 %
Hemoglobin A2 Quantitation: 2.8 % (ref 1.8–3.2)
Hemoglobin F Quantitation: 0.8 % (ref 0.0–2.0)
Hgb A: 96.4 % (ref 96.4–98.8)

## 2017-03-08 ENCOUNTER — Ambulatory Visit (INDEPENDENT_AMBULATORY_CARE_PROVIDER_SITE_OTHER): Payer: BLUE CROSS/BLUE SHIELD | Admitting: Obstetrics

## 2017-03-08 VITALS — BP 116/69 | HR 86 | Wt 201.0 lb

## 2017-03-08 DIAGNOSIS — Z8619 Personal history of other infectious and parasitic diseases: Secondary | ICD-10-CM

## 2017-03-08 DIAGNOSIS — O0992 Supervision of high risk pregnancy, unspecified, second trimester: Secondary | ICD-10-CM | POA: Diagnosis not present

## 2017-03-08 DIAGNOSIS — Z113 Encounter for screening for infections with a predominantly sexual mode of transmission: Secondary | ICD-10-CM | POA: Diagnosis not present

## 2017-03-08 DIAGNOSIS — O09899 Supervision of other high risk pregnancies, unspecified trimester: Secondary | ICD-10-CM

## 2017-03-08 DIAGNOSIS — Z98891 History of uterine scar from previous surgery: Secondary | ICD-10-CM

## 2017-03-08 DIAGNOSIS — E669 Obesity, unspecified: Secondary | ICD-10-CM

## 2017-03-08 DIAGNOSIS — Z124 Encounter for screening for malignant neoplasm of cervix: Secondary | ICD-10-CM

## 2017-03-08 DIAGNOSIS — O9989 Other specified diseases and conditions complicating pregnancy, childbirth and the puerperium: Secondary | ICD-10-CM

## 2017-03-08 DIAGNOSIS — O99820 Streptococcus B carrier state complicating pregnancy: Secondary | ICD-10-CM

## 2017-03-08 DIAGNOSIS — O0990 Supervision of high risk pregnancy, unspecified, unspecified trimester: Secondary | ICD-10-CM

## 2017-03-08 DIAGNOSIS — T8331XA Breakdown (mechanical) of intrauterine contraceptive device, initial encounter: Secondary | ICD-10-CM

## 2017-03-08 DIAGNOSIS — O99212 Obesity complicating pregnancy, second trimester: Secondary | ICD-10-CM

## 2017-03-08 DIAGNOSIS — O34219 Maternal care for unspecified type scar from previous cesarean delivery: Secondary | ICD-10-CM

## 2017-03-08 DIAGNOSIS — Z331 Pregnant state, incidental: Secondary | ICD-10-CM

## 2017-03-09 ENCOUNTER — Other Ambulatory Visit: Payer: BLUE CROSS/BLUE SHIELD

## 2017-03-09 ENCOUNTER — Encounter: Payer: Self-pay | Admitting: Obstetrics

## 2017-03-09 DIAGNOSIS — Z01419 Encounter for gynecological examination (general) (routine) without abnormal findings: Secondary | ICD-10-CM | POA: Diagnosis not present

## 2017-03-09 DIAGNOSIS — O0990 Supervision of high risk pregnancy, unspecified, unspecified trimester: Secondary | ICD-10-CM | POA: Diagnosis not present

## 2017-03-09 NOTE — Progress Notes (Signed)
Subjective:    Cheryl Stewart is being seen today for her first obstetrical visit.  This is not a planned pregnancy. She is at [redacted]w[redacted]d gestation. Her obstetrical history is significant for this pregnancy with IUD in place, obesity and short interval between pregnancies and history of 3 previous C/S's.  Unable to remove IUD.   Relationship with FOB: spouse, living together. Patient does intend to breast feed. Pregnancy history fully reviewed.  The information documented in the HPI was reviewed and verified.  Menstrual History: OB History    Gravida Para Term Preterm AB Living   6 3 3   2 3    SAB TAB Ectopic Multiple Live Births     2   0 3      Patient's last menstrual period was 12/08/2016 (approximate).    Past Medical History:  Diagnosis Date  . Genital herpes   . LGSIL (low grade squamous intraepithelial dysplasia) 07/12  . Vaginal Pap smear, abnormal     Past Surgical History:  Procedure Laterality Date  . CESAREAN SECTION  2009  . CESAREAN SECTION N/A 04/22/2014   Procedure: CESAREAN SECTION;  Surgeon: Frederico Hamman, MD;  Location: [facility];  Service: Obstetrics;  Laterality: N/A;  . CESAREAN SECTION N/A 08/24/2016   Procedure: CESAREAN SECTION;  Surgeon: Florian Buff, MD;  Location: [facility];  Service: Obstetrics;  Laterality: N/A;  . COLPOSCOPY  01/2011     (Not in a hospital admission) No Known Allergies  Social History  Substance Use Topics  . Smoking status: Never Smoker  . Smokeless tobacco: Never Used  . Alcohol use No    Family History  Problem Relation Age of Onset  . Hypertension Father      Review of Systems Constitutional: negative for weight loss Gastrointestinal: negative for vomiting Genitourinary:negative for genital lesions and vaginal discharge and dysuria Musculoskeletal:negative for back pain Behavioral/Psych: negative for abusive relationship, depression, illegal drug usage and tobacco use    Objective:    LMP 12/08/2016  (Approximate) Comment: Patient is guessing General Appearance:    Alert, cooperative, no distress, appears stated age  Head:    Normocephalic, without obvious abnormality, atraumatic  Eyes:    PERRL, conjunctiva/corneas clear, EOM's intact, fundi    benign, both eyes  Ears:    Normal TM's and external ear canals, both ears  Nose:   Nares normal, septum midline, mucosa normal, no drainage    or sinus tenderness  Throat:   Lips, mucosa, and tongue normal; teeth and gums normal  Neck:   Supple, symmetrical, trachea midline, no adenopathy;    thyroid:  no enlargement/tenderness/nodules; no carotid   bruit or JVD  Back:     Symmetric, no curvature, ROM normal, no CVA tenderness  Lungs:     Clear to auscultation bilaterally, respirations unlabored  Chest Wall:    No tenderness or deformity   Heart:    Regular rate and rhythm, S1 and S2 normal, no murmur, rub   or gallop  Breast Exam:    No tenderness, masses, or nipple abnormality  Abdomen:     Soft, non-tender, bowel sounds active all four quadrants,    no masses, no organomegaly  Genitalia:    Normal female without lesion, discharge or tenderness  Extremities:   Extremities normal, atraumatic, no cyanosis or edema  Pulses:   2+ and symmetric all extremities  Skin:   Skin color, texture, turgor normal, no rashes or lesions  Lymph nodes:   Cervical, supraclavicular,  and axillary nodes normal  Neurologic:   CNII-XII intact, normal strength, sensation and reflexes    throughout      Lab Review Urine pregnancy test Labs reviewed yes Radiologic studies reviewed yes      US OB Comp Less 14 Wks (Accession [id number]) (Order [id number])  Imaging  Date: 01/26/2017 Department: [facility] Released By: Jill Poling, RT Authorizing: Shelly Bombard, MD  Exam Information   Status Exam Begun  Exam Ended   Final [99] 01/26/2017 2:15 PM 01/26/2017 3:11 PM  PACS Images   Show images for US OB Comp Less 14  Wks  Study Result   CLINICAL DATA:  Positive pregnancy test. IUD in place. Unknown LMP.  EXAM: OBSTETRIC <14 WK [place] AND TRANSVAGINAL OB US  TECHNIQUE: Both transabdominal and transvaginal ultrasound examinations were performed for complete evaluation of the gestation as well as the maternal uterus, adnexal regions, and pelvic cul-de-sac. Transvaginal technique was performed to assess early pregnancy.  COMPARISON:  None.  FINDINGS: Intrauterine gestational sac: Single  Yolk sac:  Visualized.  Embryo:  Visualized.  Cardiac Activity: Visualized.  Heart Rate: 159  bpm  CRL:  31  mm   9 w   6 d                  [place] EDC: 08/25/2017  Subchorionic hemorrhage: Small subchorionic hemorrhage noted in the lower uterine segment.  Maternal uterus/adnexae: An IUD is visualized in the lower uterine segment below the level of the IUP. A small calcified fibroid is seen in the anterior uterine corpus which measures 3.9 cm in maximum diameter both ovaries are normal in appearance. No adnexal mass or free fluid identified.  IMPRESSION: Single living IUP measuring 9 weeks 6 days, with [place] EDC of 08/25/2017.  IUD and small subchorionic hemorrhage in lower uterine segment, below the level of the IUP.  3.9 cm anterior calcified uterine fibroid.   Electronically Signed   By: Earle Gell M.D.   On: 01/27/2017 08:14   Vitals   Height Weight BMI (Calculated)  5\' 2"  (1.575 m) 195 lb 9.6 oz (88.7 kg) 35.9  External Results Report   Open External Results Report  Imaging   Imaging Information  Signed by   Signed Date/Time  Phone Pager  Earle Gell 01/27/2017 8:14 AM [phone]  Signed   Electronically signed by Earle Gell, MD on 01/27/17 at [phone] EDT  Original Order   Ordered On Ordered By   01/16/2017 2:23 PM Jill Poling, RT           Assessment:    Pregnancy at [redacted]w[redacted]d weeks    Plan:     1. Supervision of high risk pregnancy,  antepartum Rx: - Cytology - PAP - Cervicovaginal ancillary only  2. Short interval between pregnancies affecting pregnancy, antepartum   3. IUD pregnancy - unsuccessful attempt at IUD removal - IUD left in place.  4. History of primary genital syphilis - convalescent  5. Previous cesarean section x 3   6. Obesity (BMI 35.0-39.9 without comorbidity)   Prenatal vitamins.  Counseling provided regarding continued use of seat belts, cessation of alcohol consumption, smoking or use of illicit drugs; infection precautions i.e., influenza/TDAP immunizations, toxoplasmosis,CMV, parvovirus, listeria and varicella; workplace safety, exercise during pregnancy; routine dental care, safe medications, sexual activity, hot tubs, saunas, pools, travel, caffeine use, fish and methlymercury, potential toxins, hair treatments, varicose veins Weight gain recommendations per IOM guidelines reviewed: underweight/BMI< 18.5--> gain  28 - 40 lbs; normal weight/BMI 18.5 - 24.9--> gain 25 - 35 lbs; overweight/BMI 25 - 29.9--> gain 15 - 25 lbs; obese/BMI >30->gain  11 - 20 lbs Problem list reviewed and updated. FIRST/CF mutation testing/NIPT/QUAD SCREEN/fragile X/[name] population testing/Spinal muscular atrophy discussed: requested. Role of ultrasound in pregnancy discussed; fetal survey: requested. Amniocentesis discussed: not indicated. VBAC calculator score: VBAC consent form provided No orders of the defined types were placed in this encounter.  No orders of the defined types were placed in this encounter.   Follow up in 2 weeks.  Needs OB labs drawn. 50% of 20 min visit spent on counseling and coordination of care.

## 2017-03-09 NOTE — Progress Notes (Signed)
03-08-17 Patient is in the office for initial ob visit, no complaints.

## 2017-03-11 LAB — OBSTETRIC PANEL, INCLUDING HIV
Antibody Screen: NEGATIVE
Basophils Absolute: 0 10*3/uL (ref 0.0–0.2)
Basos: 0 %
EOS (ABSOLUTE): 0.1 10*3/uL (ref 0.0–0.4)
Eos: 1 %
HIV Screen 4th Generation wRfx: NONREACTIVE
Hematocrit: 40.3 % (ref 34.0–46.6)
Hemoglobin: 13.1 g/dL (ref 11.1–15.9)
Hepatitis B Surface Ag: NEGATIVE
Immature Grans (Abs): 0 10*3/uL (ref 0.0–0.1)
Immature Granulocytes: 0 %
Lymphocytes Absolute: 1.5 10*3/uL (ref 0.7–3.1)
Lymphs: 22 %
MCH: 27.7 pg (ref 26.6–33.0)
MCHC: 32.5 g/dL (ref 31.5–35.7)
MCV: 85 fL (ref 79–97)
Monocytes Absolute: 0.6 10*3/uL (ref 0.1–0.9)
Monocytes: 9 %
Neutrophils Absolute: 4.7 10*3/uL (ref 1.4–7.0)
Neutrophils: 68 %
Platelets: 167 10*3/uL (ref 150–379)
RBC: 4.73 x10E6/uL (ref 3.77–5.28)
RDW: 21.9 % — ABNORMAL HIGH (ref 12.3–15.4)
RPR Ser Ql: REACTIVE — AB
Rh Factor: POSITIVE
Rubella Antibodies, IGG: 9.64 index (ref >0.99)
WBC: 6.8 10*3/uL (ref 3.4–10.8)

## 2017-03-11 LAB — VITAMIN D 25 HYDROXY (VIT D DEFICIENCY, FRACTURES): Vit D, 25-Hydroxy: 16.7 ng/mL — ABNORMAL LOW (ref 30.0–100.0)

## 2017-03-11 LAB — URINE CULTURE, OB REFLEX

## 2017-03-11 LAB — RPR, QUANT+TP ABS (REFLEX)
Rapid Plasma Reagin, Quant: 1:4 {titer} — ABNORMAL HIGH
T Pallidum Abs: POSITIVE — AB

## 2017-03-11 LAB — CULTURE, OB URINE

## 2017-03-11 LAB — VARICELLA ZOSTER ANTIBODY, IGG: Varicella zoster IgG: <135 index — ABNORMAL LOW (ref >165)

## 2017-03-11 LAB — HEMOGLOBIN A1C
Est. average glucose Bld gHb Est-mCnc: 100 mg/dL
Hgb A1c MFr Bld: 5.1 % (ref 4.8–5.6)

## 2017-03-12 ENCOUNTER — Emergency Department (HOSPITAL_COMMUNITY)
Admission: EM | Admit: 2017-03-12 | Discharge: 2017-03-12 | Disposition: A | Discharge status: Home / Self Care | Payer: BLUE CROSS/BLUE SHIELD | Attending: Emergency Medicine | Admitting: Emergency Medicine

## 2017-03-12 ENCOUNTER — Encounter (HOSPITAL_COMMUNITY): Payer: Self-pay | Admitting: Emergency Medicine

## 2017-03-12 DIAGNOSIS — Z79899 Other long term (current) drug therapy: Secondary | ICD-10-CM | POA: Insufficient documentation

## 2017-03-12 DIAGNOSIS — M542 Cervicalgia: Secondary | ICD-10-CM | POA: Diagnosis present

## 2017-03-12 DIAGNOSIS — M436 Torticollis: Secondary | ICD-10-CM | POA: Diagnosis not present

## 2017-03-12 MED ORDER — ACETAMINOPHEN 500 MG PO TABS
1000.0000 mg | ORAL_TABLET | Freq: Once | ORAL | Status: AC
  Administered 2017-03-12: 1000 mg via ORAL
  Filled 2017-03-12: qty 2

## 2017-03-12 MED ORDER — ACETAMINOPHEN 325 MG PO TABS: 650.0000 mg | ORAL_TABLET | Freq: Four times a day (QID) | ORAL | 0 refills | Status: DC | PRN

## 2017-03-12 NOTE — ED Provider Notes (Signed)
[facility] DEPT Provider Note   CSN: [id number] Arrival date & time: 03/12/17  1814  By signing my name below, I, Dora Sims, attest that this documentation has been prepared under the direction and in the presence of Will Detroit Frieden, PA-C. Electronically Signed: Dora Sims, Scribe. 03/12/2017. 8:53 PM.  History   Chief Complaint Chief Complaint  Patient presents with  . Neck Pain   The history is provided by the patient. No language interpreter was used.    HPI Comments: Bernardette Waldron is a G31P3A0 31 y.o. female who presents to the Emergency Department for evaluation of persistent, gradually worsening left-sided neck pain beginning yesterday. The pain shoots into the left occipital region. She states that she cannot rotate her head to the left secondary to the neck pain. There is no known cause of her neck pain. She has tried ibuprofen x2 without relief. No recent falls or injuries to her neck. Patient has no prior h/o neck problems. She denies numbness/tingling, weakness, fevers, chills, headache sore throat, dysphagia, chest pain, abdominal pain, vaginal bleeding/discharge, or any other associated symptoms.  Patient had a confirmed IUP via ultrasound on 01/27/17. She states that she is [redacted] weeks pregnant and is due on 08/25/17. She is followed by Dr. Jodi Mourning of OB/GYN.  Past Medical History:  Diagnosis Date  . Genital herpes   . LGSIL (low grade squamous intraepithelial dysplasia) 07/12  . Vaginal Pap smear, abnormal     Patient Active Problem List   Diagnosis Date Noted  . Anemia complicating pregnancy, second trimester 03/01/2017  . Encounter for IUD insertion 09/29/2016  . S/P repeat low transverse C-section 08/25/2016  . 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 Uterine fibroids affecting pregnancy in second trimester, antepartum 03/14/2016    Past Surgical History:  Procedure Laterality Date  . CESAREAN SECTION  2009  . CESAREAN SECTION N/A 04/22/2014   Procedure:  CESAREAN SECTION;  Surgeon: Frederico Hamman, MD;  Location: [facility];  Service: Obstetrics;  Laterality: N/A;  . CESAREAN SECTION N/A 08/24/2016   Procedure: CESAREAN SECTION;  Surgeon: Florian Buff, MD;  Location: [facility];  Service: Obstetrics;  Laterality: N/A;  . COLPOSCOPY  01/2011    OB History    Gravida Para Term Preterm AB Living   6 3 3   2 3    SAB TAB Ectopic Multiple Live Births     2   0 3       Home Medications    Prior to Admission medications   Medication Sig Start Date End Date Taking? Authorizing Provider  acetaminophen (TYLENOL) 325 MG tablet Take 2 tablets (650 mg total) by mouth every 6 (six) hours as needed for mild pain or moderate pain. 03/12/17   Waynetta Pean, PA-C  Fe-Succ Ac-B Cmplx-C-Ca-FA Stevphen [place] 24/6) MISC Take 1 tablet by mouth daily before breakfast. Patient not taking: Reported on 03/09/2017 02/14/17   Shelly Bombard, MD  ferrous gluconate (FERGON) 324 MG tablet Take 1 tablet (324 mg total) by mouth 2 (two) times daily with a meal. Patient not taking: Reported on 03/09/2017 03/01/17   Ardath Sax, MD  metroNIDAZOLE (FLAGYL) 500 MG tablet Take 1 tablet (500 mg total) by mouth 2 (two) times daily. Patient not taking: Reported on 02/14/2017 01/16/17   Shelly Bombard, MD  prenatal vitamin w/FE, FA (PRENATAL 1 + 1) 27-1 MG TABS tablet Take 1 tablet by mouth daily before breakfast. 01/13/17   Shelly Bombard, MD  terconazole (TERAZOL 3) 0.8 %  vaginal cream Place 1 applicator vaginally at bedtime. Patient not taking: Reported on 03/09/2017 02/14/17   Shelly Bombard, MD    Family History Family History  Problem Relation Age of Onset  . Hypertension Father     Social History Social History  Substance Use Topics  . Smoking status: Never Smoker  . Smokeless tobacco: Never Used  . Alcohol use No     Allergies   Patient has no known allergies.   Review of Systems Review of Systems  Constitutional: Negative for chills and fever.   HENT: Negative for sore throat and trouble swallowing.   Eyes: Negative for pain and visual disturbance.  Respiratory: Negative for cough and shortness of breath.   Cardiovascular: Negative for chest pain.  Gastrointestinal: Negative for abdominal pain.  Genitourinary: Negative for vaginal bleeding and vaginal discharge.  Musculoskeletal: Positive for neck pain.  Skin: Negative for rash and wound.  Neurological: Negative for dizziness, weakness, light-headedness, numbness and headaches.   Physical Exam Updated Vital Signs BP 110/66 (BP Location: Right Arm)   Pulse 87   Temp 98.4 F (36.9 C) (Oral)   Resp 16   LMP 12/08/2016 (Approximate) Comment: Patient is guessing  SpO2 98%   Physical Exam  Constitutional: She is oriented to person, place, and time. She appears well-developed and well-nourished. No distress.  Nontoxic appearing.  HENT:  Head: Normocephalic and atraumatic.  Right Ear: External ear normal.  Left Ear: External ear normal.  Mouth/Throat: Oropharynx is clear and moist.  Bilateral TM's are pearly gray without erythema or loss of landmarks.  Eyes: Pupils are equal, round, and reactive to light. Conjunctivae and EOM are normal. Right eye exhibits no discharge. Left eye exhibits no discharge.  Neck: Neck supple. No JVD present. No tracheal deviation present.  Able to place chin at chest, look up to ceiling, and rotate head greater than 45 degrees in each direction. Patient has tenderness over her left neck musculature along her trapezius muscle. No midline neck or back tenderness. No crepitus, step-offs or deformities. No meningeal signs.   Cardiovascular: Normal rate, regular rhythm, normal heart sounds and intact distal pulses.   Pulses:      Radial pulses are 2+ on the right side, and 2+ on the left side.  Pulmonary/Chest: Effort normal and breath sounds normal. No stridor. No respiratory distress.  Abdominal: Soft. There is no tenderness. There is no guarding.    Lymphadenopathy:    She has no cervical adenopathy.  Neurological: She is alert and oriented to person, place, and time. No cranial nerve deficit or sensory deficit. She exhibits normal muscle tone. Coordination normal.  Good and equal grip strengths bilaterally.  Skin: Skin is warm and dry. Capillary refill takes less than 2 seconds. No rash noted. She is not diaphoretic. No erythema. No pallor.  Psychiatric: She has a normal mood and affect. Her behavior is normal.  Nursing note and vitals reviewed.  ED Treatments / Results  Labs (all labs ordered are listed, but only abnormal results are displayed) Labs Reviewed - No data to display  EKG  EKG Interpretation None       Radiology No results found.  Procedures Procedures (including critical care time)  DIAGNOSTIC STUDIES: Oxygen Saturation is 98% on RA, normal by my interpretation.    COORDINATION OF CARE: 8:52 PM Discussed treatment plan with pt at bedside and pt agreed to plan.  Medications Ordered in ED Medications  acetaminophen (TYLENOL) tablet 1,000 mg (not administered)  Initial Impression / Assessment and Plan / ED Course  I have reviewed the triage vital signs and the nursing notes.  Pertinent labs & imaging results that were available during my care of the patient were reviewed by me and considered in my medical decision making (see chart for details).    This is a G25P3A0 31 y.o. female who presents to the Emergency Department for evaluation of persistent, gradually worsening left-sided neck pain beginning yesterday. The pain shoots into the left occipital region. She states that she cannot rotate her head to the left secondary to the neck pain. There is no known cause of her neck pain. She has tried ibuprofen x2 without relief. No recent falls or injuries to her neck. Patient has no prior h/o neck problems. She denies numbness/tingling, headache, weakness, fevers, chills, sore throat. Patient had a  confirmed IUP via ultrasound on 01/27/17. She states that she is [redacted] weeks pregnant and is due on 08/25/17. She is followed by Dr. Jodi Mourning of OB/GYN. On exam patient is afebrile nontoxic appearing. She denies tenderness over her left lateral neck musculature. No midline neck or back tenderness. No focal neurological deficits. No concern for meningitis or neck injury. Patient with torticollis. Will discharge with Tylenol as this is the only safe option while she is pregnant. I educated on using neck exercises as well as massage to the area to help with her neck pain. I advised the patient to follow-up with their primary care provider this week. I advised the patient to return to the emergency department with new or worsening symptoms or new concerns. The patient verbalized understanding and agreement with plan.     Final Clinical Impressions(s) / ED Diagnoses   Final diagnoses:  Torticollis, acute    New Prescriptions New Prescriptions   ACETAMINOPHEN (TYLENOL) 325 MG TABLET    Take 2 tablets (650 mg total) by mouth every 6 (six) hours as needed for mild pain or moderate pain.   I personally performed the services described in this documentation, which was scribed in my presence. The recorded information has been reviewed and is accurate.      Waynetta Pean, PA-C 03/12/17 2058    Little, Wenda Overland, MD 03/15/17 [phone]

## 2017-03-12 NOTE — ED Triage Notes (Signed)
Patient is here for left sided neck pain that started yesterday.  She states that it has been progressively getting worse, she is unable to turn her neck due to the pain.  Patient is currently pregnant, 14 weeks, getting prenatal care.  She is due 08/25/17.  She denies any injury to the area and no fevers.

## 2017-03-13 ENCOUNTER — Other Ambulatory Visit: Payer: Self-pay | Admitting: Obstetrics

## 2017-03-13 ENCOUNTER — Emergency Department (HOSPITAL_COMMUNITY)
Admission: EM | Admit: 2017-03-13 | Discharge: 2017-03-13 | Disposition: A | Discharge status: Home / Self Care | Payer: BLUE CROSS/BLUE SHIELD | Attending: Emergency Medicine | Admitting: Emergency Medicine

## 2017-03-13 ENCOUNTER — Encounter (HOSPITAL_COMMUNITY): Payer: Self-pay | Admitting: Emergency Medicine

## 2017-03-13 DIAGNOSIS — M436 Torticollis: Secondary | ICD-10-CM | POA: Diagnosis not present

## 2017-03-13 DIAGNOSIS — M542 Cervicalgia: Secondary | ICD-10-CM | POA: Diagnosis present

## 2017-03-13 DIAGNOSIS — Z79899 Other long term (current) drug therapy: Secondary | ICD-10-CM | POA: Diagnosis not present

## 2017-03-13 DIAGNOSIS — Z3A14 14 weeks gestation of pregnancy: Secondary | ICD-10-CM | POA: Diagnosis not present

## 2017-03-13 DIAGNOSIS — E559 Vitamin D deficiency, unspecified: Secondary | ICD-10-CM

## 2017-03-13 LAB — CYTOLOGY - PAP
Diagnosis: UNDETERMINED — AB
HPV: DETECTED — AB

## 2017-03-13 LAB — CERVICOVAGINAL ANCILLARY ONLY
Bacterial vaginitis: NEGATIVE
Candida vaginitis: NEGATIVE
Chlamydia: NEGATIVE
Neisseria Gonorrhea: NEGATIVE
Trichomonas: NEGATIVE

## 2017-03-13 MED ORDER — OXYCODONE-ACETAMINOPHEN 5-325 MG PO TABS
1.0000 | ORAL_TABLET | Freq: Once | ORAL | Status: AC
  Administered 2017-03-13: 1 via ORAL
  Filled 2017-03-13: qty 1

## 2017-03-13 MED ORDER — CYCLOBENZAPRINE HCL 10 MG PO TABS
10.0000 mg | ORAL_TABLET | Freq: Once | ORAL | Status: AC
Start: 2017-03-13 — End: 2017-03-13
  Administered 2017-03-13: 10 mg via ORAL
  Filled 2017-03-13: qty 1

## 2017-03-13 MED ORDER — CYCLOBENZAPRINE HCL 10 MG PO TABS: 10.0000 mg | ORAL_TABLET | Freq: Two times a day (BID) | ORAL | 0 refills | Status: DC | PRN

## 2017-03-13 MED ORDER — VITAMIN D 50 MCG (2000 UT) PO CAPS: 1.0000 | ORAL_CAPSULE | Freq: Every day | ORAL | 5 refills | Status: DC

## 2017-03-13 MED ORDER — LIDOCAINE 5 % EX PTCH
1.0000 | MEDICATED_PATCH | CUTANEOUS | Status: DC
  Administered 2017-03-13: 1 via TRANSDERMAL
  Filled 2017-03-13: qty 1

## 2017-03-13 NOTE — ED Provider Notes (Signed)
[facility] DEPT Provider Note   CSN: [id number] Arrival date & time: 03/13/17  0250     History   Chief Complaint Chief Complaint  Patient presents with  . Neck Pain    HPI Cheryl Stewart is a 31 y.o. female.  31 y.o. G42P3A0 female who presents to the Emergency Department for persistent left-sided neck pain. Patient noticed pain upon waking from sleep 2 days ago. It has been constant and gradually worsening. She was evaluated in the emergency department yesterday for symptoms and recommended to use Tylenol. She has tried this without relief. She has also used heat pads with little improvement. Pain is aggravated with neck movement. She notes that symptoms were atraumatic and onset. She denies a history of similar complaints. No extremity numbness, paresthesias, weakness. No fevers.  Patient had a confirmed IUP via ultrasound on 01/27/17. She states that she is [redacted] weeks pregnant and is due on 08/25/17. She is followed by Dr. Jodi Mourning of OB/GYN.      Past Medical History:  Diagnosis Date  . Genital herpes   . LGSIL (low grade squamous intraepithelial dysplasia) 07/12  . Vaginal Pap smear, abnormal     Patient Active Problem List   Diagnosis Date Noted  . Anemia complicating pregnancy, second trimester 03/01/2017  . Encounter for IUD insertion 09/29/2016  . S/P repeat low transverse C-section 08/25/2016  . 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 Uterine fibroids affecting pregnancy in second trimester, antepartum 03/14/2016    Past Surgical History:  Procedure Laterality Date  . CESAREAN SECTION  2009  . CESAREAN SECTION N/A 04/22/2014   Procedure: CESAREAN SECTION;  Surgeon: Frederico Hamman, MD;  Location: [facility];  Service: Obstetrics;  Laterality: N/A;  . CESAREAN SECTION N/A 08/24/2016   Procedure: CESAREAN SECTION;  Surgeon: Florian Buff, MD;  Location: [facility];  Service: Obstetrics;  Laterality: N/A;  . COLPOSCOPY  01/2011    OB History    Gravida Para Term Preterm AB Living   6 3 3   2 3    SAB TAB Ectopic Multiple Live Births     2   0 3       Home Medications    Prior to Admission medications   Medication Sig Start Date End Date Taking? Authorizing Provider  acetaminophen (TYLENOL) 325 MG tablet Take 2 tablets (650 mg total) by mouth every 6 (six) hours as needed for mild pain or moderate pain. 03/12/17   Waynetta Pean, PA-C  Fe-Succ Ac-B Cmplx-C-Ca-FA Stevphen [place] 24/6) MISC Take 1 tablet by mouth daily before breakfast. Patient not taking: Reported on 03/09/2017 02/14/17   Shelly Bombard, MD  ferrous gluconate (FERGON) 324 MG tablet Take 1 tablet (324 mg total) by mouth 2 (two) times daily with a meal. Patient not taking: Reported on 03/09/2017 03/01/17   Ardath Sax, MD  metroNIDAZOLE (FLAGYL) 500 MG tablet Take 1 tablet (500 mg total) by mouth 2 (two) times daily. Patient not taking: Reported on 02/14/2017 01/16/17   Shelly Bombard, MD  prenatal vitamin w/FE, FA (PRENATAL 1 + 1) 27-1 MG TABS tablet Take 1 tablet by mouth daily before breakfast. 01/13/17   Shelly Bombard, MD  terconazole (TERAZOL 3) 0.8 % vaginal cream Place 1 applicator vaginally at bedtime. Patient not taking: Reported on 03/09/2017 02/14/17   Shelly Bombard, MD    Family History Family History  Problem Relation Age of Onset  . Hypertension Father     Social History Social History  Substance Use Topics  . Smoking status: Never Smoker  . Smokeless tobacco: Never Used  . Alcohol use No     Allergies   Patient has no known allergies.   Review of Systems Review of Systems Ten systems reviewed and are negative for acute change, except as noted in the HPI.    Physical Exam Updated Vital Signs BP 127/76   Pulse 93   Temp 98.2 F (36.8 C) (Oral)   Resp 20   LMP 12/08/2016 (Approximate) Comment: Patient is guessing  SpO2 100%   Physical Exam  Constitutional: She is oriented to person, place, and time. She appears  well-developed and well-nourished. No distress.  Nontoxic appearing, the patient does seem uncomfortable. She is tearful.  HENT:  Head: Normocephalic and atraumatic.  Eyes: Conjunctivae and EOM are normal. No scleral icterus.  Neck: Normal range of motion.  No cervical midline tenderness. No bony deformities, step-offs, or crepitus. There is tenderness to palpation to the left cervical paraspinal muscles.  Cardiovascular: Normal rate, regular rhythm and intact distal pulses.   Pulmonary/Chest: Effort normal. No respiratory distress. She has no wheezes.  Respirations even and unlabored  Musculoskeletal: Normal range of motion.  Neurological: She is alert and oriented to person, place, and time. She exhibits normal muscle tone. Coordination normal.  Grip strength 5/5 bilaterally. Patient with normal strength against resistance in bilateral upper extremities. Sensation intact.  Skin: Skin is warm and dry. No rash noted. She is not diaphoretic. No erythema. No pallor.  Psychiatric: She has a normal mood and affect. Her behavior is normal.  Nursing note and vitals reviewed.    ED Treatments / Results  Labs (all labs ordered are listed, but only abnormal results are displayed) Labs Reviewed - No data to display  EKG  EKG Interpretation None       Radiology No results found.  Procedures Procedures (including critical care time)  Medications Ordered in ED Medications  lidocaine (LIDODERM) 5 % 1 patch (not administered)     Initial Impression / Assessment and Plan / ED Course  I have reviewed the triage vital signs and the nursing notes.  Pertinent labs & imaging results that were available during my care of the patient were reviewed by me and considered in my medical decision making (see chart for details).      3:30 AM Patient neurovascularly intact, presenting for torticollis which was previously evaluated today. Patient reports no improvement at home with Tylenol. Will  attempt to manage with Lidoderm patch.  4:50 AM Patient denies any improvement with lidocaine. Will consult with [facility] midlevel regarding management.  5:05 AM I have consulted with the midlevel at [facility] does confirm safety of Flexeril in 2nd trimester pregnancy. Will provide. Patient to be given one dose of Percocet in the emergency department. No plans to discharge with opiates.  5:45 AM Patient reassessed. Pain has improved to 8/10. She feels comfortable managing her symptoms further on an outpatient basis. Have continued to provide encouragement and reassurance. Will continue with Tylenol and provide short course of Flexeril. Patient to f/u with her OBGYN if symptoms persist.   Final Clinical Impressions(s) / ED Diagnoses   Final diagnoses:  Torticollis, acute    New Prescriptions New Prescriptions   No medications on file     Antonietta Breach, Hershal Coria 03/13/17 0545    Ezequiel Essex, MD 03/13/17 [phone]

## 2017-03-13 NOTE — ED Notes (Signed)
Pt departed in NAD, refused use of wheelchair.  

## 2017-03-13 NOTE — Discharge Instructions (Signed)
Continue with Tylenol and frequent heat pads to try and help work out spasm. Massage may also provide some improvement. Avoid strenuous activity or heavy lifting. You may take Flexeril as prescribed, as needed, for persistent pain or spasm. Follow-up with your OB/GYN for a recheck of symptoms.

## 2017-03-13 NOTE — ED Triage Notes (Signed)
Patient has returned due to increasing neck pain on the left side of her neck.  Patient was seen earlier in the night, has taken APAP with no relief.  Patient is currently [redacted] weeks pregnant, getting regular prenatal care and is due 08/25/17.  No injuries to the area, no fevers.

## 2017-03-16 LAB — AFP TETRA
DIA Mom Value: 0.82
DIA Value (EIA): 120.66 pg/mL
DSR (By Age)    1 IN: 527
DSR (Second Trimester) 1 IN: 5852
Gestational Age: 15.7 WEEKS
MSAFP Mom: 1.24
MSAFP: 35.1 ng/mL
MSHCG Mom: 0.54
MSHCG: 18798 m[IU]/mL
Maternal Age At EDD: 32 yr
Osb Risk: 10000
T18 (By Age): 1:2053 {titer}
Test Results:: NEGATIVE
Weight: 201 [lb_av]
uE3 Mom: 0.75
uE3 Value: 0.48 ng/mL

## 2017-03-17 ENCOUNTER — Encounter: Payer: Self-pay | Admitting: Obstetrics and Gynecology

## 2017-03-17 DIAGNOSIS — Z3480 Encounter for supervision of other normal pregnancy, unspecified trimester: Secondary | ICD-10-CM | POA: Insufficient documentation

## 2017-03-17 DIAGNOSIS — O99210 Obesity complicating pregnancy, unspecified trimester: Secondary | ICD-10-CM | POA: Insufficient documentation

## 2017-03-17 DIAGNOSIS — O2630 Retained intrauterine contraceptive device in pregnancy, unspecified trimester: Secondary | ICD-10-CM | POA: Insufficient documentation

## 2017-03-17 DIAGNOSIS — O34219 Maternal care for unspecified type scar from previous cesarean delivery: Secondary | ICD-10-CM | POA: Insufficient documentation

## 2017-03-23 ENCOUNTER — Telehealth: Payer: Self-pay

## 2017-03-23 NOTE — Telephone Encounter (Signed)
-----   Message from Shelly Bombard, MD sent at 03/13/2017  8:29 AM EDT ----- Vitamin D 2000 units Syphilis is convalescent Varicella non immune

## 2017-03-27 ENCOUNTER — Encounter: Payer: BLUE CROSS/BLUE SHIELD | Admitting: Obstetrics

## 2017-03-28 ENCOUNTER — Encounter: Payer: Self-pay | Admitting: Obstetrics

## 2017-03-28 ENCOUNTER — Ambulatory Visit (INDEPENDENT_AMBULATORY_CARE_PROVIDER_SITE_OTHER): Payer: BLUE CROSS/BLUE SHIELD | Admitting: Obstetrics

## 2017-03-28 VITALS — BP 117/78 | HR 96 | Wt 204.0 lb

## 2017-03-28 DIAGNOSIS — Z98891 History of uterine scar from previous surgery: Secondary | ICD-10-CM

## 2017-03-28 DIAGNOSIS — Z8619 Personal history of other infectious and parasitic diseases: Secondary | ICD-10-CM

## 2017-03-28 DIAGNOSIS — O34219 Maternal care for unspecified type scar from previous cesarean delivery: Secondary | ICD-10-CM

## 2017-03-28 DIAGNOSIS — O0992 Supervision of high risk pregnancy, unspecified, second trimester: Secondary | ICD-10-CM

## 2017-03-28 DIAGNOSIS — Z975 Presence of (intrauterine) contraceptive device: Secondary | ICD-10-CM

## 2017-03-28 DIAGNOSIS — O09899 Supervision of other high risk pregnancies, unspecified trimester: Secondary | ICD-10-CM

## 2017-03-28 DIAGNOSIS — E669 Obesity, unspecified: Secondary | ICD-10-CM

## 2017-03-28 DIAGNOSIS — O99212 Obesity complicating pregnancy, second trimester: Secondary | ICD-10-CM

## 2017-03-28 DIAGNOSIS — O0990 Supervision of high risk pregnancy, unspecified, unspecified trimester: Secondary | ICD-10-CM

## 2017-03-28 NOTE — Progress Notes (Signed)
Subjective:  Cheryl Stewart is a 31 y.o. [phone] at [redacted]w[redacted]d being seen today for ongoing prenatal care.  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  Patient reports no complaints.  Contractions: Not present. Vag. Bleeding: None.   . Denies leaking of fluid.   The following portions of the patient's history were reviewed and updated as appropriate: allergies, current medications, past family history, past medical history, past social history, past surgical history and problem list. Problem list updated.  Objective:   Vitals:   03/28/17 0901  BP: 117/78  Pulse: 96  Weight: 204 lb (92.5 kg)    Fetal Status: Fetal Heart Rate (bpm): 150         General:  Alert, oriented and cooperative. Patient is in no acute distress.  Skin: Skin is warm and dry. No rash noted.   Cardiovascular: Normal heart rate noted  Respiratory: Normal respiratory effort, no problems with respiration noted  Abdomen: Soft, gravid, appropriate for gestational age. Pain/Pressure: Absent     Pelvic:  Cervical exam deferred        Extremities: Normal range of motion.  Edema: None  Mental Status: Normal mood and affect. Normal behavior. Normal judgment and thought content.   Urinalysis:      Assessment and Plan:  Pregnancy: [id number] at [redacted]w[redacted]d  1. Supervision of other normal pregnancy, antepartum Rx: - [place] MFM OB COMP + 14 WK; Future - AFP TETRA  Preterm labor symptoms and general obstetric precautions including but not limited to vaginal bleeding, contractions,  leaking of fluid and fetal movement were reviewed in detail with the patient. Please refer to After Visit Summary for other counseling recommendations.  Return in about 4 weeks (around 04/25/2017) for ROB.   Shelly Bombard, MD

## 2017-03-28 NOTE — Progress Notes (Signed)
Patient is doing well today.

## 2017-04-05 ENCOUNTER — Other Ambulatory Visit: Payer: BLUE CROSS/BLUE SHIELD

## 2017-04-06 ENCOUNTER — Other Ambulatory Visit: Payer: Self-pay | Admitting: Hematology and Oncology

## 2017-04-07 ENCOUNTER — Ambulatory Visit: Payer: BLUE CROSS/BLUE SHIELD

## 2017-04-07 ENCOUNTER — Ambulatory Visit: Payer: BLUE CROSS/BLUE SHIELD | Admitting: Hematology and Oncology

## 2017-04-10 ENCOUNTER — Ambulatory Visit (HOSPITAL_COMMUNITY)
Admission: RE | Admit: 2017-04-10 | Discharge: 2017-04-10 | Disposition: A | Discharge status: Home / Self Care | Payer: BLUE CROSS/BLUE SHIELD | Source: Ambulatory Visit | Attending: Obstetrics | Admitting: Obstetrics

## 2017-04-10 DIAGNOSIS — O3412 Maternal care for benign tumor of corpus uteri, second trimester: Secondary | ICD-10-CM | POA: Diagnosis not present

## 2017-04-10 DIAGNOSIS — Z3A20 20 weeks gestation of pregnancy: Secondary | ICD-10-CM | POA: Insufficient documentation

## 2017-04-10 DIAGNOSIS — Z3689 Encounter for other specified antenatal screening: Secondary | ICD-10-CM | POA: Insufficient documentation

## 2017-04-10 DIAGNOSIS — O0990 Supervision of high risk pregnancy, unspecified, unspecified trimester: Secondary | ICD-10-CM

## 2017-04-25 ENCOUNTER — Encounter: Payer: BLUE CROSS/BLUE SHIELD | Admitting: Obstetrics

## 2017-06-01 ENCOUNTER — Encounter: Payer: Self-pay | Admitting: Obstetrics

## 2017-06-02 ENCOUNTER — Ambulatory Visit (INDEPENDENT_AMBULATORY_CARE_PROVIDER_SITE_OTHER): Payer: BLUE CROSS/BLUE SHIELD | Admitting: Obstetrics and Gynecology

## 2017-06-02 ENCOUNTER — Telehealth: Payer: Self-pay | Admitting: Pediatrics

## 2017-06-02 VITALS — BP 105/69 | HR 98 | Wt 218.0 lb

## 2017-06-02 DIAGNOSIS — O2630 Retained intrauterine contraceptive device in pregnancy, unspecified trimester: Secondary | ICD-10-CM

## 2017-06-02 DIAGNOSIS — Z3480 Encounter for supervision of other normal pregnancy, unspecified trimester: Secondary | ICD-10-CM

## 2017-06-02 DIAGNOSIS — O34219 Maternal care for unspecified type scar from previous cesarean delivery: Secondary | ICD-10-CM

## 2017-06-02 MED ORDER — METRONIDAZOLE 500 MG PO TABS: 500.0000 mg | ORAL_TABLET | Freq: Two times a day (BID) | ORAL | 0 refills | Status: DC

## 2017-06-02 NOTE — Telephone Encounter (Signed)
Pt called in stating she has BV and needs rx sent in.  I sent rx per standing OB Protocol.

## 2017-06-02 NOTE — Progress Notes (Signed)
   PRENATAL VISIT NOTE  Subjective:  Cheryl Stewart is a 31 y.o. [phone] at [redacted]w[redacted]d being seen today for ongoing prenatal care.  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  Patient reports no complaints.  Contractions: Not present. Vag. Bleeding: None.  Movement: Present. Denies leaking of fluid.   The following portions of the patient's history were reviewed and updated as appropriate: allergies, current medications, past family history, past medical history, past social history, past surgical history and problem list. Problem list updated.  Objective:   Vitals:   06/02/17 0938  BP: 105/69  Pulse: 98  Weight: 218 lb (98.9 kg)    Fetal Status: Fetal Heart Rate (bpm): 144 Fundal Height: 29 cm Movement: Present     General:  Alert, oriented and cooperative. Patient is in no acute distress.  Skin: Skin is warm and dry. No rash noted.   Cardiovascular: Normal heart rate noted  Respiratory: Normal respiratory effort, no problems with respiration noted  Abdomen: Soft, gravid, appropriate for gestational age.  Pain/Pressure: Absent     Pelvic: Cervical exam deferred        Extremities: Normal range of motion.  Edema: None  Mental Status:  Normal mood and affect. Normal behavior. Normal judgment and thought content.   Assessment and Plan:  Pregnancy: [id number] at [redacted]w[redacted]d  1. Supervision of other normal pregnancy, antepartum Patient is doing well. She reports missing 2 appointments due to job training requirements Anatomy ultrasound  reviewed and recommendation for follow up explained. Patient agreed Patient to return prior to next appointment for 2 hour glucola  2. Pregnancy complicated by intrauterine device (IUD)   3. Previous cesarean section complicating pregnancy Patient will be scheduled for repeat with BTL Patient plans to reactive Medicaid as secondary. Will have her sign BTL form during glucola or at next visit  Preterm labor symptoms and general obstetric precautions including but not limited to vaginal bleeding, contractions, leaking of fluid and fetal movement were reviewed in detail with the patient. Please refer to After Visit Summary for other counseling recommendations.  Return in about 2 weeks (around 06/16/2017) for ROB.   Mora Bellman, MD

## 2017-06-06 ENCOUNTER — Other Ambulatory Visit: Payer: BLUE CROSS/BLUE SHIELD

## 2017-06-06 DIAGNOSIS — Z3480 Encounter for supervision of other normal pregnancy, unspecified trimester: Secondary | ICD-10-CM

## 2017-06-08 LAB — CBC
Hematocrit: 33.7 % — ABNORMAL LOW (ref 34.0–46.6)
Hemoglobin: 11 g/dL — ABNORMAL LOW (ref 11.1–15.9)
MCH: 27.9 pg (ref 26.6–33.0)
MCHC: 32.6 g/dL (ref 31.5–35.7)
MCV: 86 fL (ref 79–97)
Platelets: 139 10*3/uL — ABNORMAL LOW (ref 150–379)
RBC: 3.94 x10E6/uL (ref 3.77–5.28)
RDW: 14.5 % (ref 12.3–15.4)
WBC: 6 10*3/uL (ref 3.4–10.8)

## 2017-06-08 LAB — GLUCOSE TOLERANCE, 2 HOURS W/ 1HR
Glucose, 1 hour: 169 mg/dL (ref 65–179)
Glucose, 2 hour: 120 mg/dL (ref 65–152)
Glucose, Fasting: 79 mg/dL (ref 65–91)

## 2017-06-08 LAB — RPR, QUANT+TP ABS (REFLEX)
Rapid Plasma Reagin, Quant: 1:2 {titer} — ABNORMAL HIGH
T Pallidum Abs: POSITIVE — AB

## 2017-06-08 LAB — HIV ANTIBODY (ROUTINE TESTING W REFLEX): HIV Screen 4th Generation wRfx: NONREACTIVE

## 2017-06-08 LAB — RPR: RPR Ser Ql: REACTIVE — AB

## 2017-06-13 ENCOUNTER — Other Ambulatory Visit: Payer: BLUE CROSS/BLUE SHIELD

## 2017-06-20 ENCOUNTER — Encounter: Payer: BLUE CROSS/BLUE SHIELD | Admitting: Certified Nurse Midwife

## 2017-06-29 ENCOUNTER — Encounter: Payer: BLUE CROSS/BLUE SHIELD | Admitting: Certified Nurse Midwife

## 2017-07-05 ENCOUNTER — Ambulatory Visit (HOSPITAL_COMMUNITY)
Admission: RE | Admit: 2017-07-05 | Discharge: 2017-07-05 | Disposition: A | Discharge status: Home / Self Care | Payer: BLUE CROSS/BLUE SHIELD | Source: Ambulatory Visit | Attending: Obstetrics and Gynecology | Admitting: Obstetrics and Gynecology

## 2017-07-05 ENCOUNTER — Ambulatory Visit (INDEPENDENT_AMBULATORY_CARE_PROVIDER_SITE_OTHER): Payer: BLUE CROSS/BLUE SHIELD | Admitting: Certified Nurse Midwife

## 2017-07-05 ENCOUNTER — Encounter: Payer: Self-pay | Admitting: *Deleted

## 2017-07-05 ENCOUNTER — Encounter (HOSPITAL_COMMUNITY): Payer: Self-pay

## 2017-07-05 VITALS — BP 122/77 | HR 102 | Wt 221.0 lb

## 2017-07-05 DIAGNOSIS — Z3480 Encounter for supervision of other normal pregnancy, unspecified trimester: Secondary | ICD-10-CM

## 2017-07-05 DIAGNOSIS — O34219 Maternal care for unspecified type scar from previous cesarean delivery: Secondary | ICD-10-CM | POA: Insufficient documentation

## 2017-07-05 DIAGNOSIS — Z3483 Encounter for supervision of other normal pregnancy, third trimester: Secondary | ICD-10-CM

## 2017-07-05 DIAGNOSIS — Z3A32 32 weeks gestation of pregnancy: Secondary | ICD-10-CM | POA: Diagnosis not present

## 2017-07-05 DIAGNOSIS — Z362 Encounter for other antenatal screening follow-up: Secondary | ICD-10-CM | POA: Insufficient documentation

## 2017-07-05 DIAGNOSIS — Z98891 History of uterine scar from previous surgery: Secondary | ICD-10-CM

## 2017-07-05 MED ORDER — VITAMIN D (ERGOCALCIFEROL) 1.25 MG (50000 UNIT) PO CAPS: 50000.0000 [IU] | ORAL_CAPSULE | ORAL | 2 refills | Status: DC

## 2017-07-05 NOTE — Progress Notes (Signed)
Pt denies issues at this time

## 2017-07-05 NOTE — Progress Notes (Signed)
   PRENATAL VISIT NOTE  Subjective:  Cheryl Stewart is a 31 y.o. [phone] at [redacted]w[redacted]d being seen today for ongoing prenatal care.  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  Patient reports no complaints.  Contractions: Not present. Vag. Bleeding: None.  Movement: Present. Denies leaking of fluid.   The following portions of the patient's history were reviewed and updated as appropriate: allergies, current medications, past family history, past medical history, past social history, past surgical history and problem list. Problem list updated.  Objective:   Vitals:   07/05/17 1031  BP: 122/77  Pulse: (!) 102  Weight: 221 lb (100.2 kg)    Fetal Status: Fetal Heart Rate (bpm): 150; doppler Fundal Height: 34 cm Movement: Present     General:  Alert, oriented and cooperative. Patient is in no acute distress.  Skin: Skin is warm and dry. No rash noted.   Cardiovascular: Normal heart rate noted  Respiratory: Normal respiratory effort, no problems with respiration noted  Abdomen: Soft, gravid, appropriate for gestational age.  Pain/Pressure: Absent     Pelvic: Cervical exam deferred        Extremities: Normal range of motion.  Edema: None  Mental Status:  Normal mood and affect. Normal behavior. Normal judgment and thought content.   Assessment and Plan:  Pregnancy: [id number] at [redacted]w[redacted]d  1. Supervision of other normal pregnancy, antepartum     Doing well.  BTL paperwork completed.  - Vitamin D, Ergocalciferol, (DRISDOL) 50000 units  CAPS capsule; Take 1 capsule (50,000 Units total) by mouth every 7 (seven) days.  Dispense: 30 capsule; Refill: 2  2. S/P repeat low transverse C-section     Repeat C-section scheduled for 39 weeks. 4th C-section planned with BTL.  Preterm labor symptoms and general obstetric precautions including but not limited to vaginal bleeding, contractions, leaking of fluid and fetal movement were reviewed in detail with the patient. Please refer to After Visit Summary for other counseling recommendations.  Return in about 2 weeks (around 07/19/2017) for ROB.   Morene Crocker, CNM

## 2017-07-19 ENCOUNTER — Encounter: Payer: BLUE CROSS/BLUE SHIELD | Admitting: Certified Nurse Midwife

## 2017-08-08 ENCOUNTER — Telehealth (HOSPITAL_COMMUNITY): Payer: Self-pay | Admitting: *Deleted

## 2017-08-08 NOTE — Telephone Encounter (Signed)
Preadmission screen  

## 2017-08-09 ENCOUNTER — Telehealth (HOSPITAL_COMMUNITY): Payer: Self-pay | Admitting: *Deleted

## 2017-08-09 ENCOUNTER — Ambulatory Visit (INDEPENDENT_AMBULATORY_CARE_PROVIDER_SITE_OTHER): Payer: Medicaid Other | Admitting: Certified Nurse Midwife

## 2017-08-09 ENCOUNTER — Encounter: Payer: Self-pay | Admitting: Certified Nurse Midwife

## 2017-08-09 VITALS — BP 124/76 | HR 106 | Wt 221.4 lb

## 2017-08-09 DIAGNOSIS — O34219 Maternal care for unspecified type scar from previous cesarean delivery: Secondary | ICD-10-CM

## 2017-08-09 DIAGNOSIS — Z3480 Encounter for supervision of other normal pregnancy, unspecified trimester: Secondary | ICD-10-CM

## 2017-08-09 DIAGNOSIS — E669 Obesity, unspecified: Secondary | ICD-10-CM

## 2017-08-09 DIAGNOSIS — O99213 Obesity complicating pregnancy, third trimester: Secondary | ICD-10-CM

## 2017-08-09 NOTE — Progress Notes (Signed)
Pt denies concerns at this time. 

## 2017-08-09 NOTE — Telephone Encounter (Signed)
Preadmission screen  

## 2017-08-09 NOTE — Progress Notes (Signed)
   PRENATAL VISIT NOTE  Subjective:  Cheryl Stewart is a 32 y.o. [phone] at [redacted]w[redacted]d being seen today for ongoing prenatal care.  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  Patient reports no complaints.  Contractions: Irregular. Vag. Bleeding: None.  Movement: Present. Denies leaking of fluid.   The following portions of the patient's history were reviewed and updated as appropriate: allergies, current medications, past family history, past medical history, past social history, past surgical history and problem list. Problem list updated.  Objective:   Vitals:   08/09/17 1537  BP: 124/76  Pulse: (!) 106  Weight: 221 lb 6.4 oz (100.4 kg)    Fetal Status: Fetal Heart Rate (bpm): 156; doppler Fundal Height: 39 cm Movement: Present     General:  Alert, oriented and cooperative. Patient is in no acute distress.  Skin: Skin is warm and dry. No rash noted.   Cardiovascular: Normal heart rate noted  Respiratory: Normal respiratory effort, no problems with respiration noted  Abdomen: Soft, gravid, appropriate for gestational age.  Pain/Pressure: Present     Pelvic: Cervical exam deferred        Extremities: Normal range of motion.  Edema: None  Mental Status:  Normal mood and affect. Normal behavior. Normal judgment and thought content.   Assessment and Plan:  Pregnancy: [id number] at [redacted]w[redacted]d  1. Supervision of other normal pregnancy, antepartum     Doing well  2. Previous cesarean section complicating pregnancy      Repeat  C-section scheduled for 08/21/17.   3. Obesity affecting pregnancy in third trimester       Term labor symptoms and general obstetric precautions including but not limited to vaginal bleeding, contractions, leaking of fluid and fetal movement were reviewed in detail with the patient. Please refer to After Visit Summary for other counseling recommendations.  Return in about 1 week (around 08/16/2017) for [place].   Morene Crocker, CNM

## 2017-08-10 ENCOUNTER — Encounter (HOSPITAL_COMMUNITY): Payer: Self-pay

## 2017-08-16 ENCOUNTER — Ambulatory Visit (INDEPENDENT_AMBULATORY_CARE_PROVIDER_SITE_OTHER): Payer: Medicaid Other | Admitting: Certified Nurse Midwife

## 2017-08-16 ENCOUNTER — Encounter: Payer: Self-pay | Admitting: Certified Nurse Midwife

## 2017-08-16 DIAGNOSIS — Z3480 Encounter for supervision of other normal pregnancy, unspecified trimester: Secondary | ICD-10-CM

## 2017-08-16 NOTE — Progress Notes (Signed)
   PRENATAL VISIT NOTE  Subjective:  Cheryl Stewart is a 32 y.o. [phone] at [redacted]w[redacted]d being seen today for ongoing prenatal care.  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  Patient reports no complaints.  Contractions: Irregular. Vag. Bleeding: None.  Movement: Present. Denies leaking of fluid.   The following portions of the patient's history were reviewed and updated as appropriate: allergies, current medications, past family history, past medical history, past social history, past surgical history and problem list. Problem list updated.  Objective:   Vitals:   08/16/17 1540  BP: 114/69  Pulse: (!) 106  Weight: 233 lb (105.7 kg)    Fetal Status: Fetal Heart Rate (bpm): 139; doppler Fundal Height: 40 cm Movement: Present     General:  Alert, oriented and cooperative. Patient is in no acute distress.  Skin: Skin is warm and dry. No rash noted.   Cardiovascular: Normal heart rate noted  Respiratory: Normal respiratory effort, no problems with respiration noted  Abdomen: Soft, gravid, appropriate for gestational age.  Pain/Pressure: Absent     Pelvic: Cervical exam deferred        Extremities: Normal range of motion.  Edema: None  Mental Status:  Normal mood and affect. Normal behavior. Normal judgment and thought content.   Assessment and Plan:  Pregnancy: [id number] at [redacted]w[redacted]d  1. Supervision of other normal pregnancy, antepartum     Doing well.  Pelvic exam declined.  Repeat C-section scheduled for 08/21/17.    Term labor  symptoms and general obstetric precautions including but not limited to vaginal bleeding, contractions, leaking of fluid and fetal movement were reviewed in detail with the patient. Please refer to After Visit Summary for other counseling recommendations.  Return in about 2 weeks (around 08/30/2017) for incision check.   Morene Crocker, CNM

## 2017-08-17 NOTE — Patient Instructions (Signed)
Cheryl Stewart  08/17/2017   Your procedure is scheduled on:  08/31/2017  Enter through the Main Entrance of [facility] [place] up the phone at the desk and dial [phone]  Call this number if you have problems the morning of [phone]  Remember:   Do not eat food:After Midnight.  Do not drink clear liquids: After Midnight.  Take these medicines the morning of surgery with A SIP OF WATER: none   Do not wear jewelry, make-up or nail polish.  Do not wear lotions, powders, or perfumes. Do not wear deodorant.  Do not shave 48 hours prior to surgery.  Do not bring valuables to the hospital.  [facility] is not   responsible for any belongings or valuables brought to the hospital.  Contacts, dentures or bridgework may not be worn into surgery.  Leave suitcase in the car. After surgery it may be brought to your room.  For patients admitted to the hospital, checkout time is 11:00 AM the day of              discharge.    N/A   Please read over the following fact sheets that you were given:   Surgical Site Infection Prevention

## 2017-08-18 ENCOUNTER — Encounter (HOSPITAL_COMMUNITY)
Admission: RE | Admit: 2017-08-18 | Discharge: 2017-08-18 | Disposition: A | Discharge status: Home / Self Care | Payer: Medicaid Other | Source: Ambulatory Visit | Attending: Obstetrics and Gynecology | Admitting: Obstetrics and Gynecology

## 2017-08-18 DIAGNOSIS — Z302 Encounter for sterilization: Secondary | ICD-10-CM | POA: Diagnosis present

## 2017-08-18 LAB — CBC
HCT: 33.1 % — ABNORMAL LOW (ref 36.0–46.0)
Hemoglobin: 10.6 g/dL — ABNORMAL LOW (ref 12.0–15.0)
MCH: 24.8 pg — ABNORMAL LOW (ref 26.0–34.0)
MCHC: 32 g/dL (ref 30.0–36.0)
MCV: 77.3 fL — ABNORMAL LOW (ref 78.0–100.0)
Platelets: 168 10*3/uL (ref 150–400)
RBC: 4.28 MIL/uL (ref 3.87–5.11)
RDW: 15.6 % — ABNORMAL HIGH (ref 11.5–15.5)
WBC: 5.2 10*3/uL (ref 4.0–10.5)

## 2017-08-18 LAB — TYPE AND SCREEN
ABO/RH(D): O POS
Antibody Screen: NEGATIVE

## 2017-08-20 LAB — RPR, QUANT+TP ABS (REFLEX)
Rapid Plasma Reagin, Quant: 1:4 {titer} — ABNORMAL HIGH
T Pallidum Abs: POSITIVE — AB

## 2017-08-20 LAB — RPR: RPR Ser Ql: REACTIVE — AB

## 2017-08-21 ENCOUNTER — Inpatient Hospital Stay (HOSPITAL_COMMUNITY)
Admission: AD | Admit: 2017-08-21 | Discharge: 2017-08-23 | DRG: 784 | Disposition: A | Discharge status: Home / Self Care | Payer: Medicaid Other | Source: Ambulatory Visit | Attending: Obstetrics and Gynecology | Admitting: Obstetrics and Gynecology

## 2017-08-21 ENCOUNTER — Inpatient Hospital Stay (HOSPITAL_COMMUNITY): Payer: Medicaid Other | Admitting: Anesthesiology

## 2017-08-21 ENCOUNTER — Encounter (HOSPITAL_COMMUNITY)
Admission: AD | Disposition: A | Discharge status: Home / Self Care | Payer: Self-pay | Source: Ambulatory Visit | Attending: Obstetrics and Gynecology

## 2017-08-21 ENCOUNTER — Encounter (HOSPITAL_COMMUNITY): Payer: Self-pay | Admitting: *Deleted

## 2017-08-21 DIAGNOSIS — Z302 Encounter for sterilization: Secondary | ICD-10-CM | POA: Diagnosis not present

## 2017-08-21 DIAGNOSIS — R87610 Atypical squamous cells of undetermined significance on cytologic smear of cervix (ASC-US): Secondary | ICD-10-CM

## 2017-08-21 DIAGNOSIS — O99214 Obesity complicating childbirth: Secondary | ICD-10-CM | POA: Diagnosis present

## 2017-08-21 DIAGNOSIS — D649 Anemia, unspecified: Secondary | ICD-10-CM | POA: Diagnosis present

## 2017-08-21 DIAGNOSIS — E669 Obesity, unspecified: Secondary | ICD-10-CM | POA: Diagnosis present

## 2017-08-21 DIAGNOSIS — O2630 Retained intrauterine contraceptive device in pregnancy, unspecified trimester: Secondary | ICD-10-CM | POA: Diagnosis present

## 2017-08-21 DIAGNOSIS — A6000 Herpesviral infection of urogenital system, unspecified: Secondary | ICD-10-CM | POA: Diagnosis present

## 2017-08-21 DIAGNOSIS — Z30432 Encounter for removal of intrauterine contraceptive device: Secondary | ICD-10-CM | POA: Diagnosis not present

## 2017-08-21 DIAGNOSIS — O34211 Maternal care for low transverse scar from previous cesarean delivery: Secondary | ICD-10-CM | POA: Diagnosis present

## 2017-08-21 DIAGNOSIS — O9832 Other infections with a predominantly sexual mode of transmission complicating childbirth: Secondary | ICD-10-CM | POA: Diagnosis present

## 2017-08-21 DIAGNOSIS — O9902 Anemia complicating childbirth: Secondary | ICD-10-CM | POA: Diagnosis present

## 2017-08-21 DIAGNOSIS — Z3A39 39 weeks gestation of pregnancy: Secondary | ICD-10-CM

## 2017-08-21 DIAGNOSIS — Z98891 History of uterine scar from previous surgery: Secondary | ICD-10-CM

## 2017-08-21 DIAGNOSIS — O99012 Anemia complicating pregnancy, second trimester: Secondary | ICD-10-CM | POA: Diagnosis present

## 2017-08-21 DIAGNOSIS — O99210 Obesity complicating pregnancy, unspecified trimester: Secondary | ICD-10-CM | POA: Diagnosis present

## 2017-08-21 SURGERY — Surgical Case
Anesthesia: Spinal | Site: Abdomen | Laterality: Bilateral | Wound class: Clean Contaminated

## 2017-08-21 MED ORDER — DIPHENHYDRAMINE HCL 50 MG/ML IJ SOLN: 12.5000 mg | INTRAMUSCULAR | Status: DC | PRN

## 2017-08-21 MED ORDER — SCOPOLAMINE 1 MG/3DAYS TD PT72
MEDICATED_PATCH | TRANSDERMAL | Status: DC | PRN
  Administered 2017-08-21: 1 via TRANSDERMAL

## 2017-08-21 MED ORDER — TETANUS-DIPHTH-ACELL PERTUSSIS 5-2.5-18.5 LF-MCG/0.5 IM SUSP: 0.5000 mL | Freq: Once | INTRAMUSCULAR | Status: DC

## 2017-08-21 MED ORDER — ONDANSETRON HCL 4 MG/2ML IJ SOLN
INTRAMUSCULAR | Status: DC | PRN
  Administered 2017-08-21: 4 mg via INTRAVENOUS

## 2017-08-21 MED ORDER — ONDANSETRON HCL 4 MG/2ML IJ SOLN: 4.0000 mg | Freq: Three times a day (TID) | INTRAMUSCULAR | Status: DC | PRN

## 2017-08-21 MED ORDER — PHENYLEPHRINE 8 MG IN D5W 100 ML (0.08MG/ML) PREMIX OPTIME
INJECTION | INTRAVENOUS | Status: DC | PRN
  Administered 2017-08-21: 60 ug/min via INTRAVENOUS

## 2017-08-21 MED ORDER — PROMETHAZINE HCL 25 MG/ML IJ SOLN: 6.2500 mg | INTRAMUSCULAR | Status: DC | PRN

## 2017-08-21 MED ORDER — PHENYLEPHRINE 8 MG IN D5W 100 ML (0.08MG/ML) PREMIX OPTIME
INJECTION | INTRAVENOUS | Status: AC
  Filled 2017-08-21: qty 100

## 2017-08-21 MED ORDER — CEFAZOLIN SODIUM-DEXTROSE 2-4 GM/100ML-% IV SOLN
2.0000 g | INTRAVENOUS | Status: AC
  Administered 2017-08-21: 2 g via INTRAVENOUS
  Filled 2017-08-21: qty 100

## 2017-08-21 MED ORDER — SENNOSIDES-DOCUSATE SODIUM 8.6-50 MG PO TABS
2.0000 | ORAL_TABLET | ORAL | Status: DC
  Administered 2017-08-22 (×2): 2 via ORAL
  Filled 2017-08-21 (×2): qty 2

## 2017-08-21 MED ORDER — MORPHINE SULFATE (PF) 0.5 MG/ML IJ SOLN
INTRAMUSCULAR | Status: AC
  Filled 2017-08-21: qty 10

## 2017-08-21 MED ORDER — PRENATAL MULTIVITAMIN CH
1.0000 | ORAL_TABLET | Freq: Every day | ORAL | Status: DC
  Administered 2017-08-22 – 2017-08-23 (×2): 1 via ORAL
  Filled 2017-08-21 (×2): qty 1

## 2017-08-21 MED ORDER — ENOXAPARIN SODIUM 40 MG/0.4ML ~~LOC~~ SOLN
40.0000 mg | SUBCUTANEOUS | Status: DC
  Administered 2017-08-22 – 2017-08-23 (×2): 40 mg via SUBCUTANEOUS
  Filled 2017-08-21 (×3): qty 0.4

## 2017-08-21 MED ORDER — LACTATED RINGERS IV SOLN
INTRAVENOUS | Status: DC
  Administered 2017-08-21 (×3): via INTRAVENOUS

## 2017-08-21 MED ORDER — SIMETHICONE 80 MG PO CHEW
80.0000 mg | CHEWABLE_TABLET | Freq: Three times a day (TID) | ORAL | Status: DC
  Administered 2017-08-22 – 2017-08-23 (×3): 80 mg via ORAL
  Filled 2017-08-21 (×4): qty 1

## 2017-08-21 MED ORDER — SIMETHICONE 80 MG PO CHEW: 80.0000 mg | CHEWABLE_TABLET | ORAL | Status: DC | PRN

## 2017-08-21 MED ORDER — SCOPOLAMINE 1 MG/3DAYS TD PT72
MEDICATED_PATCH | TRANSDERMAL | Status: AC
  Filled 2017-08-21: qty 1

## 2017-08-21 MED ORDER — OXYCODONE HCL 5 MG PO TABS: 5.0000 mg | ORAL_TABLET | Freq: Once | ORAL | Status: DC | PRN

## 2017-08-21 MED ORDER — NALBUPHINE HCL 10 MG/ML IJ SOLN: 5.0000 mg | Freq: Once | INTRAMUSCULAR | Status: DC | PRN

## 2017-08-21 MED ORDER — SODIUM CHLORIDE 0.9% FLUSH: 3.0000 mL | INTRAVENOUS | Status: DC | PRN

## 2017-08-21 MED ORDER — OXYTOCIN 10 UNIT/ML IJ SOLN
INTRAMUSCULAR | Status: AC
  Filled 2017-08-21: qty 4

## 2017-08-21 MED ORDER — SOD CITRATE-CITRIC ACID 500-334 MG/5ML PO SOLN
30.0000 mL | ORAL | Status: AC
  Administered 2017-08-21: 30 mL via ORAL
  Filled 2017-08-21: qty 15

## 2017-08-21 MED ORDER — NALBUPHINE HCL 10 MG/ML IJ SOLN
5.0000 mg | INTRAMUSCULAR | Status: DC | PRN
  Administered 2017-08-21 – 2017-08-22 (×2): 5 mg via INTRAVENOUS
  Filled 2017-08-21: qty 1

## 2017-08-21 MED ORDER — IBUPROFEN 600 MG PO TABS
600.0000 mg | ORAL_TABLET | Freq: Four times a day (QID) | ORAL | Status: DC
  Administered 2017-08-22 – 2017-08-23 (×7): 600 mg via ORAL
  Filled 2017-08-21 (×7): qty 1

## 2017-08-21 MED ORDER — NALOXONE HCL 4 MG/10ML IJ SOLN: 1.0000 ug/kg/h | INTRAVENOUS | Status: DC | PRN

## 2017-08-21 MED ORDER — OXYTOCIN 10 UNIT/ML IJ SOLN
INTRAMUSCULAR | Status: DC | PRN
  Administered 2017-08-21: 40 [IU] via INTRAVENOUS

## 2017-08-21 MED ORDER — HYDROMORPHONE HCL 1 MG/ML IJ SOLN: 0.2500 mg | INTRAMUSCULAR | Status: DC | PRN

## 2017-08-21 MED ORDER — ACETAMINOPHEN 325 MG PO TABS: 650.0000 mg | ORAL_TABLET | ORAL | Status: DC | PRN

## 2017-08-21 MED ORDER — DIPHENHYDRAMINE HCL 25 MG PO CAPS: 25.0000 mg | ORAL_CAPSULE | Freq: Four times a day (QID) | ORAL | Status: DC | PRN

## 2017-08-21 MED ORDER — MENTHOL 3 MG MT LOZG: 1.0000 | LOZENGE | OROMUCOSAL | Status: DC | PRN

## 2017-08-21 MED ORDER — NALBUPHINE HCL 10 MG/ML IJ SOLN
5.0000 mg | Freq: Once | INTRAMUSCULAR | Status: DC | PRN
  Filled 2017-08-21: qty 1

## 2017-08-21 MED ORDER — OXYCODONE HCL 5 MG PO TABS: 5.0000 mg | ORAL_TABLET | ORAL | Status: DC | PRN

## 2017-08-21 MED ORDER — BUPIVACAINE IN DEXTROSE 0.75-8.25 % IT SOLN
INTRATHECAL | Status: AC
  Filled 2017-08-21: qty 2

## 2017-08-21 MED ORDER — LACTATED RINGERS IV SOLN
INTRAVENOUS | Status: DC
  Administered 2017-08-22: 01:00:00 via INTRAVENOUS

## 2017-08-21 MED ORDER — KETOROLAC TROMETHAMINE 30 MG/ML IJ SOLN
30.0000 mg | Freq: Once | INTRAMUSCULAR | Status: DC | PRN
  Administered 2017-08-21: 30 mg via INTRAVENOUS

## 2017-08-21 MED ORDER — OXYTOCIN 40 UNITS IN LACTATED RINGERS INFUSION - SIMPLE MED: 2.5000 [IU]/h | INTRAVENOUS | Status: AC

## 2017-08-21 MED ORDER — OXYCODONE HCL 5 MG PO TABS: 10.0000 mg | ORAL_TABLET | ORAL | Status: DC | PRN

## 2017-08-21 MED ORDER — BUPIVACAINE IN DEXTROSE 0.75-8.25 % IT SOLN
INTRATHECAL | Status: DC | PRN
  Administered 2017-08-21: 12 mg via INTRATHECAL

## 2017-08-21 MED ORDER — KETOROLAC TROMETHAMINE 30 MG/ML IJ SOLN
INTRAMUSCULAR | Status: AC
  Filled 2017-08-21: qty 1

## 2017-08-21 MED ORDER — ZOLPIDEM TARTRATE 5 MG PO TABS: 5.0000 mg | ORAL_TABLET | Freq: Every evening | ORAL | Status: DC | PRN

## 2017-08-21 MED ORDER — NALOXONE HCL 0.4 MG/ML IJ SOLN: 0.4000 mg | INTRAMUSCULAR | Status: DC | PRN

## 2017-08-21 MED ORDER — SIMETHICONE 80 MG PO CHEW
80.0000 mg | CHEWABLE_TABLET | ORAL | Status: DC
  Administered 2017-08-22 (×2): 80 mg via ORAL
  Filled 2017-08-21 (×2): qty 1

## 2017-08-21 MED ORDER — DEXAMETHASONE SODIUM PHOSPHATE 10 MG/ML IJ SOLN
INTRAMUSCULAR | Status: DC | PRN
  Administered 2017-08-21: 10 mg via INTRAVENOUS

## 2017-08-21 MED ORDER — MORPHINE SULFATE (PF) 0.5 MG/ML IJ SOLN
INTRAMUSCULAR | Status: DC | PRN
  Administered 2017-08-21: .2 mg via EPIDURAL

## 2017-08-21 MED ORDER — OXYCODONE HCL 5 MG/5ML PO SOLN: 5.0000 mg | Freq: Once | ORAL | Status: DC | PRN

## 2017-08-21 MED ORDER — MEPERIDINE HCL 25 MG/ML IJ SOLN: 6.2500 mg | INTRAMUSCULAR | Status: DC | PRN

## 2017-08-21 MED ORDER — SCOPOLAMINE 1 MG/3DAYS TD PT72: 1.0000 | MEDICATED_PATCH | Freq: Once | TRANSDERMAL | Status: DC

## 2017-08-21 MED ORDER — SODIUM CHLORIDE 0.9 % IR SOLN
Status: DC | PRN
  Administered 2017-08-21: 1

## 2017-08-21 MED ORDER — DIBUCAINE 1 % RE OINT: 1.0000 "application " | TOPICAL_OINTMENT | RECTAL | Status: DC | PRN

## 2017-08-21 MED ORDER — COCONUT OIL OIL: 1.0000 "application " | TOPICAL_OIL | Status: DC | PRN

## 2017-08-21 MED ORDER — NALBUPHINE HCL 10 MG/ML IJ SOLN: 5.0000 mg | INTRAMUSCULAR | Status: DC | PRN

## 2017-08-21 MED ORDER — WITCH HAZEL-GLYCERIN EX PADS: 1.0000 "application " | MEDICATED_PAD | CUTANEOUS | Status: DC | PRN

## 2017-08-21 MED ORDER — DIPHENHYDRAMINE HCL 25 MG PO CAPS
25.0000 mg | ORAL_CAPSULE | ORAL | Status: DC | PRN
  Administered 2017-08-22 (×2): 25 mg via ORAL
  Filled 2017-08-21 (×2): qty 1

## 2017-08-21 MED ORDER — EPINEPHRINE PF 1 MG/ML IJ SOLN
INTRAMUSCULAR | Status: AC
  Filled 2017-08-21: qty 1

## 2017-08-21 SURGICAL SUPPLY — 32 items
APL SKNCLS STERI-STRIP NONHPOA (GAUZE/BANDAGES/DRESSINGS) ×1
BENZOIN TINCTURE PRP APPL 2/3 (GAUZE/BANDAGES/DRESSINGS) ×1 IMPLANT
BRR ADH 6X5 SEPRAFILM 1 SHT (MISCELLANEOUS)
CLAMP CORD UMBIL (MISCELLANEOUS) IMPLANT
CLOSURE STERI STRIP 1/2 X4 (GAUZE/BANDAGES/DRESSINGS) ×1 IMPLANT
DRSG OPSITE POSTOP 4X10 (GAUZE/BANDAGES/DRESSINGS) ×2 IMPLANT
DURAPREP 26ML APPLICATOR (WOUND CARE) ×2 IMPLANT
ELECT REM PT RETURN 9FT ADLT (ELECTROSURGICAL) ×2
ELECTRODE REM PT RTRN 9FT ADLT (ELECTROSURGICAL) ×1 IMPLANT
EXTRACTOR VACUUM M CUP 4 TUBE (SUCTIONS) IMPLANT
GAUZE SPONGE 4X4 12PLY STRL LF (GAUZE/BANDAGES/DRESSINGS) ×2 IMPLANT
GLOVE BIOGEL PI IND STRL 6.5 (GLOVE) ×1 IMPLANT
GLOVE BIOGEL PI IND STRL 7.0 (GLOVE) ×1 IMPLANT
GLOVE BIOGEL PI INDICATOR 6.5 (GLOVE) ×1
GLOVE BIOGEL PI INDICATOR 7.0 (GLOVE) ×1
GLOVE SURG SS PI 6.0 STRL IVOR (GLOVE) ×2 IMPLANT
GOWN STRL REUS W/TWL LRG LVL3 (GOWN DISPOSABLE) ×4 IMPLANT
KIT ABG SYR 3ML LUER SLIP (SYRINGE) IMPLANT
NDL HYPO 25X5/8 SAFETYGLIDE (NEEDLE) IMPLANT
NEEDLE HYPO 25X5/8 SAFETYGLIDE (NEEDLE) IMPLANT
NS IRRIG 1000ML POUR BTL (IV SOLUTION) ×2 IMPLANT
PACK C SECTION WH (CUSTOM PROCEDURE TRAY) ×2 IMPLANT
PAD ABD 7.5X8 STRL (GAUZE/BANDAGES/DRESSINGS) ×1 IMPLANT
PAD OB MATERNITY 4.3X12.25 (PERSONAL CARE ITEMS) ×2 IMPLANT
PENCIL SMOKE EVAC W/HOLSTER (ELECTROSURGICAL) ×2 IMPLANT
RTRCTR C-SECT PINK 25CM LRG (MISCELLANEOUS) IMPLANT
SEPRAFILM MEMBRANE 5X6 (MISCELLANEOUS) IMPLANT
SUT PLAIN 0 NONE (SUTURE) ×2 IMPLANT
SUT VIC AB 0 CT1 36 (SUTURE) ×8 IMPLANT
SUT VIC AB 4-0 KS 27 (SUTURE) ×2 IMPLANT
TOWEL OR 17X24 6PK STRL BLUE (TOWEL DISPOSABLE) ×2 IMPLANT
TRAY FOLEY BAG SILVER LF 14FR (SET/KITS/TRAYS/PACK) ×2 IMPLANT

## 2017-08-21 NOTE — Anesthesia Procedure Notes (Signed)
Spinal  Patient location during procedure: OR Staffing Anesthesiologist: Chauncy Mangiaracina, MD Performed: anesthesiologist  Preanesthetic Checklist Completed: patient identified, site marked, surgical consent, pre-op evaluation, timeout performed, IV checked, risks and benefits discussed and monitors and equipment checked Spinal Block Patient position: sitting Prep: site prepped and draped and DuraPrep Patient monitoring: heart rate, continuous pulse ox and blood pressure Approach: midline Location: L2-3 Injection technique: single-shot Needle Needle type: Sprotte  Needle gauge: 24 G Needle length: 9 cm Additional Notes Expiration date of kit checked and confirmed. Patient tolerated procedure well, without complications.       

## 2017-08-21 NOTE — Anesthesia Preprocedure Evaluation (Signed)
Anesthesia Evaluation  Patient identified by MRN, date of birth, ID band Patient awake    Reviewed: Allergy & Precautions, H&P , NPO status , Patient's Chart, lab work & pertinent test results  Airway Mallampati: II  TM Distance: >3 FB Neck ROM: full    Dental no notable dental hx.    Pulmonary neg pulmonary ROS,    Pulmonary exam normal        Cardiovascular negative cardio ROS Normal cardiovascular exam     Neuro/Psych negative neurological ROS  negative psych ROS   GI/Hepatic negative GI ROS, Neg liver ROS,   Endo/Other  negative endocrine ROS  Renal/GU negative Renal ROS     Musculoskeletal   Abdominal (+) + obese,   Peds  Hematology negative hematology ROS (+) anemia ,   Anesthesia Other Findings   Reproductive/Obstetrics (+) Pregnancy                             Anesthesia Physical  Anesthesia Plan  ASA: II  Anesthesia Plan: Spinal   Post-op Pain Management:    Induction:   PONV Risk Score and Plan: 4 or greater and Ondansetron, Dexamethasone, Scopolamine patch - Pre-op and Treatment may vary due to age or medical condition  Airway Management Planned:   Additional Equipment:   Intra-op Plan:   Post-operative Plan:   Informed Consent: I have reviewed the patients History and Physical, chart, labs and discussed the procedure including the risks, benefits and alternatives for the proposed anesthesia with the patient or authorized representative who has indicated his/her understanding and acceptance.   Dental advisory given  Plan Discussed with: CRNA and Surgeon  Anesthesia Plan Comments:        Anesthesia Quick Evaluation

## 2017-08-21 NOTE — Transfer of Care (Signed)
Immediate Anesthesia Transfer of Care Note  Patient: Cheryl Stewart  Procedure(s) Performed: CESAREAN SECTION WITH BILATERAL TUBAL LIGATION (Bilateral Abdomen)  Patient Location: PACU  Anesthesia Type:Spinal  Level of Consciousness: awake  Airway & Oxygen Therapy: Patient Spontanous Breathing  Post-op Assessment: Report given to RN  Post vital signs: Reviewed  Last Vitals:  Vitals:   08/21/17 1219  BP: 137/72  Pulse: 91  Temp: 36.5 C    Last Pain:  Vitals:   08/21/17 1219  TempSrc: Oral      Patients Stated Pain Goal: 4 (86/75/44 9201)  Complications: No apparent anesthesia complications

## 2017-08-21 NOTE — H&P (Signed)
Cheryl Stewart is a 32 y.o. female [phone] at 29w2 presenting for scheduled repeat cesarean section and bilateral tubal ligation. Patient reports feeling well. She reports some irregular contractions and good fetal movement. She denies vaginal bleeding or leakage of fluid. Patient with prenatal care at [facility] since 15 weeks. Prenatal care complicated by 3 previous cesarean section, obesity, and pregnancy conceived with IUD in place. Patient also desires permanent sterilization.  OB History    Gravida Para Term Preterm AB Living   6 3 3   2 3    SAB TAB Ectopic Multiple Live Births     2   0 3     Past Medical History:  Diagnosis Date  . Genital herpes   . LGSIL (low grade squamous intraepithelial dysplasia) 07/12  . Vaginal Pap smear, abnormal    Past Surgical History:  Procedure Laterality Date  . CESAREAN SECTION  2009  . CESAREAN SECTION N/A 04/22/2014   Procedure: CESAREAN SECTION;  Surgeon: Frederico Hamman, MD;  Location: [facility];  Service: Obstetrics;  Laterality: N/A;  . CESAREAN SECTION N/A 08/24/2016   Procedure: CESAREAN SECTION;  Surgeon: Florian Buff, MD;  Location: [place];  Service: Obstetrics;  Laterality: N/A;  . COLPOSCOPY  01/2011   Family History: family history includes Hypertension in her father. Social History:  reports that  has never smoked. she has never used smokeless tobacco. She reports that she does not drink alcohol or use drugs.     Maternal Diabetes: No Genetic Screening: Normal Maternal Ultrasounds/Referrals: Normal Fetal Ultrasounds or other Referrals:  None Maternal Substance Abuse:  No Significant Maternal Medications:  None Significant Maternal Lab Results:  Lab values include: Other: positive RPR titer 1:4 from a previously treated infection Other Comments:  None  ROS  See pertinent in HPI History   Blood pressure 137/72, pulse 91, temperature 97.7 F (36.5 C), temperature source Oral, height 5\' 2"  (1.575 m), weight 225 lb  4.8 oz (102.2 kg), last menstrual period 12/08/2016, not currently breastfeeding. Exam Physical Exam  GENERAL: Well-developed, well-nourished female in no acute distress.  HEENT: Normocephalic, atraumatic. Sclerae anicteric.  LUNGS: Clear to auscultation bilaterally.  HEART: Regular rate and rhythm. ABDOMEN: Soft, nontender, gravid PELVIC: Not indicated EXTREMITIES: No cyanosis, clubbing, or edema, 2+ distal pulses.  Prenatal labs: ABO, Rh: --/--/O POS (01/18 1102) Antibody: NEG (01/18 1102) Rubella: 9.64 (08/09 1621) RPR: Reactive (01/18 1102)  HBsAg: Negative (08/09 1621)  HIV: Non Reactive (11/06 1050)  GBS:   patient declined  Assessment/Plan: 32 yo [id number] at [redacted]w[redacted]d here for scheduled repeat cesarean section and bilateral tubal ligation - Risks, benefits and alternatives were explained including but not limited to risks of bleeding, infection and damage to adjacent organs. Patient verbalized understanding. - Patient also desires permanent sterilization. Risks and benefits of procedure discussed with patient including permanence of method, bleeding, infection, injury to surrounding organs and need for additional procedures. Risk failure of 0.5-1% with increased risk of ectopic gestation if pregnancy occurs was also discussed with patient. All questions were answered   Nataline Basara 08/21/2017, 12:52 PM

## 2017-08-21 NOTE — Op Note (Signed)
Cheryl Stewart PROCEDURE DATE: 08/21/2017  PREOPERATIVE DIAGNOSIS: Intrauterine pregnancy at  [redacted]w[redacted]d weeks gestation; previous uterine incision kerr x3 or greater and desire for permanent sterilization  POSTOPERATIVE DIAGNOSIS: The same  PROCEDURE:     Cesarean Section and bilateral tubal ligation (modified Pomeroy method) and IUD removal  SURGEON:  Dr. Mora Bellman  ASSISTANT: Dr. Lambert Keto  INDICATIONS: Cheryl Stewart is a 32 y.o. [id number] at [redacted]w[redacted]d scheduled for cesarean section secondary to previous uterine incision kerr x3 or greater and desire for permanent sterilization.  The risks of cesarean section discussed with the patient included but were not limited to: bleeding which may require transfusion or reoperation; infection which may require antibiotics; injury to bowel, bladder, ureters or other surrounding organs; injury to the fetus; need for additional procedures including hysterectomy in the event of a life-threatening hemorrhage; placental abnormalities wth subsequent pregnancies, incisional problems, thromboembolic phenomenon and other postoperative/anesthesia complications. The patient concurred with the proposed plan, giving informed written consent for the procedure.  Patient also desires permanent sterilization. Risks and benefits of procedure discussed with patient including permanence of method, bleeding, infection, injury to surrounding organs and need for additional procedures. Risk failure of 0.5-1% with increased risk of ectopic gestation if pregnancy occurs was also discussed with patient.   FINDINGS:  Viable female infant in cephalic presentation.  Apgars 8 and 9.  Clear amniotic fluid.  Intact placenta, three vessel cord.  Normal uterus, fallopian tubes and ovaries bilaterally. IUD found located on the exterior lower posterior aspect of the uterus with strings visualized in the endometrial canal. The entire device was found in the peritoneal cavity with strings extending into the  endocervical canal.  ANESTHESIA:    Spinal INTRAVENOUS FLUIDS:2400 ml ESTIMATED BLOOD LOSS: 813 mL ml URINE OUTPUT:  200 ml SPECIMENS: Placenta sent to L&D, and portions of right and left fallopian tubes sent to pathology COMPLICATIONS: None immediate  PROCEDURE IN DETAIL:  The patient received intravenous antibiotics and had sequential compression devices applied to her lower extremities while in the preoperative area.  She was then taken to the operating room where anesthesia was induced and was found to be adequate. A foley catheter was placed into her bladder and attached to Cheryl Stewart gravity. She was then placed in a dorsal supine position with a leftward tilt, and prepped and draped in a sterile manner. After an adequate timeout was performed, a Pfannenstiel skin incision was made with scalpel and carried through to the underlying layer of fascia. The fascia was incised in the midline and this incision was extended bilaterally using the Mayo scissors. Kocher clamps were applied to the superior aspect of the fascial incision and the underlying rectus muscles were dissected off bluntly. A similar process was carried out on the inferior aspect of the facial incision. The rectus muscles were separated in the midline bluntly and the peritoneum was entered bluntly. No significant pelvic adhesions were visualized or palpated. The Alexis self-retaining retractor was introduced into the abdominal cavity. Attention was turned to the lower uterine segment where a transverse hysterotomy was made with a scalpel and extended bilaterally bluntly. The infant was successfully delivered and delayed cord clamping was performed for 1 minute. The cord was clamped and cut and infant was handed over to awaiting neonatology team. Uterine massage was then administered and the placenta delivered intact with three-vessel cord. The uterus was cleared of clot and debris.  The hysterotomy was closed with 0 Vicryl in a running  locked fashion, and an imbricating layer  was also placed with a 0 Vicryl. Overall, excellent hemostasis was noted. The pelvis copiously irrigated and cleared of all clot and debris. The IUD was carefully grasped with a Kelly clamp and removed without difficulty. The patient's left fallopian tube was then identified, brought to the incision, and grasped with a Babcock clamp. The tube was then followed out to the fimbria. The Babcock clamp was then used to grasp the tube approximately 4 cm from the cornual region. A 3 cm segment of the tube was then ligated with free tie of plain gut suture, transected and excised. Good hemostasis was noted and the tube was returned to the abdomen. The right fallopian tube was then identified to its fimbriated end, ligated, and a 3 cm segment excised in a similar fashion. Excellent hemostasis was noted, and the tube returned to the abdomen.  Hemostasis was confirmed on all surfaces.  The peritoneum and the muscles were reapproximated using 0 vicryl interrupted stitches. The fascia was then closed using 0 Vicryl in a running fashion.  The skin was closed in a subcuticular fashion using 3.0 Vicryl. The patient tolerated the procedure well. Sponge, lap, instrument and needle counts were correct x 2. She was taken to the recovery room in stable condition.    Cheryl Stewart ConstantMD  08/21/2017 3:29 PM

## 2017-08-22 ENCOUNTER — Encounter (HOSPITAL_COMMUNITY): Payer: Self-pay

## 2017-08-22 LAB — CBC
HCT: 27.4 % — ABNORMAL LOW (ref 36.0–46.0)
Hemoglobin: 9.1 g/dL — ABNORMAL LOW (ref 12.0–15.0)
MCH: 25.3 pg — ABNORMAL LOW (ref 26.0–34.0)
MCHC: 33.2 g/dL (ref 30.0–36.0)
MCV: 76.3 fL — ABNORMAL LOW (ref 78.0–100.0)
Platelets: 163 10*3/uL (ref 150–400)
RBC: 3.59 MIL/uL — ABNORMAL LOW (ref 3.87–5.11)
RDW: 15.4 % (ref 11.5–15.5)
WBC: 10.8 10*3/uL — ABNORMAL HIGH (ref 4.0–10.5)

## 2017-08-22 LAB — CREATININE, SERUM
Creatinine, Ser: 0.54 mg/dL (ref 0.44–1.00)
GFR calc Af Amer: >60 mL/min (ref >60)
GFR calc non Af Amer: >60 mL/min (ref >60)

## 2017-08-22 MED ORDER — FERROUS SULFATE 325 (65 FE) MG PO TABS
325.0000 mg | ORAL_TABLET | Freq: Every day | ORAL | Status: DC
  Administered 2017-08-22 – 2017-08-23 (×2): 325 mg via ORAL
  Filled 2017-08-22 (×2): qty 1

## 2017-08-22 NOTE — Anesthesia Postprocedure Evaluation (Signed)
Anesthesia Post Note  Patient: Cheryl Stewart  Procedure(s) Performed: CESAREAN SECTION WITH BILATERAL TUBAL LIGATION (Bilateral Abdomen)     Patient location during evaluation: Mother Baby Anesthesia Type: Spinal Level of consciousness: awake and alert Pain management: pain level controlled Vital Signs Assessment: post-procedure vital signs reviewed and stable Respiratory status: spontaneous breathing and nonlabored ventilation Cardiovascular status: stable Postop Assessment: no headache, no backache, spinal receding, patient able to bend at knees, adequate PO intake and no apparent nausea or vomiting Anesthetic complications: no    Last Vitals:  Vitals:   08/22/17 0345 08/22/17 0549  BP: (!) 100/50   Pulse: 71 89  Resp: 18   Temp: 36.9 C   SpO2: 97% 93%    Last Pain:  Vitals:   08/22/17 0345  TempSrc: Oral   Pain Goal: Patients Stated Pain Goal: 0 (08/22/17 0345)               Jabier Mutton

## 2017-08-22 NOTE — Addendum Note (Signed)
Addendum  created 08/22/17 0750 by Hewitt Blade, CRNA   Sign clinical note

## 2017-08-22 NOTE — Progress Notes (Signed)
Subjective: Postpartum Day #1: Cesarean Delivery 01/21 @3pm  Patient reports no Nausea, vomiting, incisional or abdominal pain. Tolerating PO. Is up and moving around well. Took a shower this morning. Has not had BM or flatus yet. Foley recently removed, has not voided yet own her own. Baby is feeding well.  Objective: Vital signs in last 24 hours: Temp:  [97.7 F (36.5 C)-99.1 F (37.3 C)] 98.4 F (36.9 C) (01/22 0345) Pulse Rate:  [71-95] 89 (01/22 0549) Resp:  [15-21] 18 (01/22 0345) BP: (100-137)/(50-73) 100/50 (01/22 0345) SpO2:  [92 %-100 %] 93 % (01/22 0549) Weight:  [97.8 kg (215 lb 8 oz)-102.2 kg (225 lb 4.8 oz)] 97.8 kg (215 lb 8 oz) (01/22 0700)  Physical Exam:  General: alert, cooperative, appears stated age and no distress Heart: Regular Rate and rhythm without murmur. Lung: CTAB without wheezing or rales. Lochia: appropriate Uterine Fundus: Firm, non tender Incision: healing slowly, slight clear drainage present, honeycomb dressing mildly saturated with light red blood. No visible dishiscence. DVT Evaluation: No evidence of DVT seen on physical exam. No cords or calf tenderness. No significant calf/ankle edema.  CBC Latest Ref Rng & Units 08/22/2017 08/18/2017 06/06/2017  WBC 4.0 - 10.5 K/uL 10.8(H) 5.2 6.0  Hemoglobin 12.0 - 15.0 g/dL 9.1(L) 10.6(L) 11.0(L)  Hematocrit 36.0 - 46.0 % 27.4(L) 33.1(L) 33.7(L)  Platelets 150 - 400 K/uL 163 168 139(L)  MCV       76.3   77.3   Assessment/Plan: 32 y.o G6P4 female POD#1 RLTCS Status post Cesarean section. Doing well postoperatively. Baby feeding well.  Encourage PO intake and passing of gas today. Begin Iron supplementation Continue current care and monitor BP  MOF: Breast and Bottle MOC: BTL Circ: Outpatient, clinic list provided.    Rulon Eisenmenger 08/22/2017, 7:37 AM

## 2017-08-22 NOTE — Lactation Note (Signed)
This note was copied from a baby's chart. Lactation Consultation Note  Patient Name: Boy Terrell Shimko MVEHM'C Date: 08/22/2017 Reason for consult: Initial assessment   Initial assessment with Exp BF mom of 40 hour old infant. Infant with 8 BF attempts, 2 formula bottle attempts, 5 voids and 1 stool since birth. LATCH scores 5-9.   Mom reports she does not have and milk, reviewed colostrum, NB nutritional needs, NB feeding behaviors and milk coming to volume. Enc mom to feed infant STS 8-12 x in 24 hours at first feeding cues, offering both breasts with each feeding. Enc mom to hand express before and after feeding to stimulate milk production. Enc mom to keep infant awake at the breast and to massage/compress breast during feedings. Enc mom to hand express and spoon feed colostrum versus using formula if infant will not awaken to feed or feeds for short periods of time on the breast. Reviewed hand expression with mom.   Mom latched infant to the left breast in the cradle hold. Infant latched deeply with flanged lips, rhythmic suckles and intermittent swallows. Infant needed a little stimulation to maintain suckling at the breast. Enc mom to feed infant STS to assist with awakening and stimulating milk production.   BF Resources handout and [place] Brochure given, mom informed of IP/OP Services, BF Support Groups and [place] phone #. Mom reports she does not have any questions/concerns at this time. Mom to call out for feeding assistance as needed.       Maternal Data Has patient been taught Hand Expression?: Yes Does the patient have breastfeeding experience prior to this delivery?: Yes  Feeding Feeding Type: Breast Fed Nipple Type: Slow - flow Length of feed: 10 min(still feeding when LC left room)  LATCH Score Latch: Grasps breast easily, tongue down, lips flanged, rhythmical sucking.  Audible Swallowing: A few with stimulation  Type of Nipple: Everted at rest and after stimulation  Comfort  (Breast/Nipple): Soft / non-tender  Hold (Positioning): Assistance needed to correctly position infant at breast and maintain latch.  LATCH Score: 8  Interventions Interventions: Breast feeding basics reviewed;Assisted with latch;Adjust position;Support pillows;Skin to skin;Position options;Breast massage;Breast compression;Hand express  Lactation Tools Discussed/Used WIC Program: Yes   Consult Status Consult Status: Follow-up Date: 08/23/17 Follow-up type: In-patient    Debby Freiberg Libbey Duce 08/22/2017, 10:53 AM

## 2017-08-22 NOTE — Progress Notes (Signed)
Honeycomb completely saturated with old serosanguinous drainage. Per MD, honeycomb to be changed. Dressing changed, no active bleeding or drainage noted.

## 2017-08-22 NOTE — Anesthesia Postprocedure Evaluation (Signed)
Anesthesia Post Note  Patient: Cheryl Stewart  Procedure(s) Performed: CESAREAN SECTION WITH BILATERAL TUBAL LIGATION (Bilateral Abdomen)     Patient location during evaluation: PACU Anesthesia Type: Spinal Level of consciousness: awake and alert Pain management: pain level controlled Vital Signs Assessment: post-procedure vital signs reviewed and stable Respiratory status: spontaneous breathing and respiratory function stable Cardiovascular status: blood pressure returned to baseline and stable Postop Assessment: spinal receding Anesthetic complications: no    Last Vitals:  Vitals:   08/22/17 0345 08/22/17 0549  BP: (!) 100/50   Pulse: 71 89  Resp: 18   Temp: 36.9 C   SpO2: 97% 93%    Last Pain:  Vitals:   08/22/17 0345  TempSrc: Oral                 Nolon Nations

## 2017-08-23 MED ORDER — FERROUS SULFATE 325 (65 FE) MG PO TABS: 325.0000 mg | ORAL_TABLET | Freq: Every day | ORAL | 3 refills | Status: DC

## 2017-08-23 MED ORDER — OXYCODONE-ACETAMINOPHEN 5-325 MG PO TABS: 1.0000 | ORAL_TABLET | ORAL | 0 refills | Status: DC | PRN

## 2017-08-23 MED ORDER — IBUPROFEN 600 MG PO TABS: 600.0000 mg | ORAL_TABLET | Freq: Four times a day (QID) | ORAL | 0 refills | Status: DC

## 2017-08-23 NOTE — Lactation Note (Signed)
This note was copied from a baby's chart. Lactation Consultation Note Baby 46 hrs old.. Mom stated she hopes to go home today. This is mom's 4th baby. Mom BF others for 3 months. Mom's youngest are 32 yr old and 69 yr old. Mom stated she is breast/formula, baby doesn't like the bottle yet. Noted baby hadn't had stool in 28 hrs. Mom stated had a good one. Mom stated baby is wanting to BF constantly, discussed cluster feeding. Noted mom has wide space between her front teeth and lisp. Asked mom did her other children have difficulty BF or painful BF mom stated no. Denies this baby having a lot of pain during BF. Mom stated it hurts some at times. Noted baby supine feeding in cradle position w/head turned towards mom. Assisted in repositioning baby facing mom, also placing pillows for support and comfort. Mom thank full for assistance.  Discussed milk supply, engorgement, assessing breast for transfer after feeding, and I&O. Asked mom if she had any questions or concerns, mom stated no.  Asked mom if needed to see [place] before d/c, mom stated no. Encouraged to call if any further questions or concerns.   Patient Name: Cheryl Stewart HTDSK'A Date: 08/23/2017 Reason for consult: Follow-up assessment   Maternal Data    Feeding Feeding Type: Breast Fed Length of feed: 15 min(still BF)  LATCH Score Latch: Grasps breast easily, tongue down, lips flanged, rhythmical sucking.  Audible Swallowing: A few with stimulation  Type of Nipple: Everted at rest and after stimulation  Comfort (Breast/Nipple): Soft / non-tender  Hold (Positioning): Assistance needed to correctly position infant at breast and maintain latch.  LATCH Score: 8  Interventions Interventions: Breast feeding basics reviewed;Breast compression;Adjust position;Skin to skin;Breast massage;Support pillows;Position options  Lactation Tools Discussed/Used     Consult Status Consult Status: Complete Date: 08/23/17    Theodoro Kalata 08/23/2017, 12:45 AM

## 2017-08-23 NOTE — Discharge Summary (Signed)
OB Discharge Summary     Patient Name: Cheryl Stewart DOB: 26-Jun-1986 MRN: [id number]  Date of admission: 08/21/2017 Delivering MD: Mora Bellman   Date of discharge: 08/23/2017  Admitting diagnosis: RCS Undesired Fertility IUD Removal Intrauterine pregnancy: [redacted]w[redacted]d     Secondary diagnosis:  Active Problems:   S/P repeat low transverse C-section   Anemia complicating pregnancy, second trimester   Obesity complicating pregnancy   Pregnancy complicated by intrauterine device (IUD)   ASCUS of cervix with negative high risk HPV   Status post repeat low transverse cesarean section  Additional problems: None     Discharge diagnosis: Term Pregnancy Delivered                                                                                                Post partum procedures:postpartum tubal ligation  Augmentation: None  Complications: None immediate. IUD found located on the exterior lower posterior aspect of the uterus with strings visualized in the endometrial canal. The entire device was found in the peritoneal cavity with strings extending into the endocervical canal. EBL 861mL  Hospital course:  Sceduled C/S   32 y.o. yo [id number] at [redacted]w[redacted]d was admitted to the hospital 08/21/2017 for scheduled cesarean section with the following indication:Elective Repeat.  Membrane Rupture Time/Date: 12:26 PM ,08/21/2017   Patient delivered a Viable female infant.08/21/2017  Details of operation can be found in separate operative note.  Pateint had an uncomplicated postpartum course.  She is ambulating, tolerating a regular diet, passing flatus, and urinating well. Patient is discharged home in stable condition on  08/23/17   Physical exam  Vitals:   08/22/17 1150 08/22/17 1728 08/23/17 0534 08/23/17 0648  BP: 119/60 (!) 106/54 (!) 114/56   Pulse: 83 66 71   Resp: 18 17 18    Temp:  98.4 F (36.9 C) 98 F (36.7 C)   TempSrc:  Oral Oral   SpO2: 98%     Weight:    98.7 kg (217 lb 8 oz)  Height:        General: alert, cooperative and no distress Heart: Regular rate and rhythm. No murmurs Lungs: CTAB without wheezing or rales Lochia: appropriate Uterine Fundus: Firm and nontender to palpation Incision: Healing well with no significant drainage, No significant erythema, Dressing is clean, dry, and intact DVT Evaluation: No evidence of DVT seen on physical exam. No cords or calf tenderness. No significant calf/ankle edema.  Labs: Lab Results  Component Value Date   WBC 10.8 (H) 08/22/2017   HGB 9.1 (L) 08/22/2017   HCT 27.4 (L) 08/22/2017   MCV 76.3 (L) 08/22/2017   PLT 163 08/22/2017   CMP Latest Ref Rng & Units 08/22/2017  Glucose 70 - 99 mg/dL -  BUN 6 - 23 mg/dL -  Creatinine 0.44 - 1.00 mg/dL 0.54  Sodium 135 - 145 mEq/L -  Potassium 3.5 - 5.1 mEq/L -  Chloride 96 - 112 mEq/L -  CO2 19 - 32 mEq/L -  Calcium 8.4 - 10.5 mg/dL -  Total Protein 6.0 - 8.3 g/dL -  Total Bilirubin 0.3 - 1.2 mg/dL -  Alkaline Phos 39 - 117 U/L -  AST 0 - 37 U/L -  ALT 0 - 35 U/L -    Discharge instruction: per After Visit Summary and "Baby and Me Booklet".  After visit meds:  Allergies as of 08/23/2017   No Known Allergies     Medication List    TAKE these medications   ferrous sulfate 325 (65 FE) MG tablet Take 1 tablet (325 mg total) by mouth daily with breakfast.   ibuprofen 600 MG tablet Commonly known as:  ADVIL,MOTRIN Take 1 tablet (600 mg total) by mouth every 6 (six) hours.   valACYclovir 1000 MG tablet Commonly known as:  VALTREX Take 1,000 mg by mouth daily as needed (outbreaks).   Vitamin D (Ergocalciferol) 50000 units Caps capsule Commonly known as:  DRISDOL Take 1 capsule (50,000 Units total) by mouth every 7 (seven) days. What changed:  additional instructions       Diet: routine diet  Activity: Advance as tolerated. Pelvic rest for 6 weeks.   Outpatient follow up:2 weeks Follow up Appt: Future Appointments  Date Time Provider [place]   09/05/2017  2:45 PM Morene Crocker, CNM [facility] None  09/19/2017  2:30 PM Denney, Rachelle A, CNM [facility] None   Follow up Visit:No Follow-up on file.  Postpartum contraception: Tubal Ligation  Newborn Data: Live born female  Birth Weight: 8 lb 6.9 oz (3825 g) APGAR: 8, 9  Newborn Delivery   Birth date/time:  08/21/2017 14:27:00 Delivery type:  C-Section, Low Transverse C-section categorization:  Repeat     Baby Feeding: Bottle and Breast Disposition:home with mother   08/23/2017 Larwance Rote, MD  OB FELLOW DISCHARGE ATTESTATION  I have seen and examined this patient. I agree with above documentation and have made edits as needed.   Luiz Blare, DO

## 2017-09-05 ENCOUNTER — Encounter: Payer: Self-pay | Admitting: Certified Nurse Midwife

## 2017-09-05 ENCOUNTER — Ambulatory Visit (INDEPENDENT_AMBULATORY_CARE_PROVIDER_SITE_OTHER): Payer: Medicaid Other | Admitting: Certified Nurse Midwife

## 2017-09-05 VITALS — BP 136/82 | HR 63 | Wt 202.7 lb

## 2017-09-05 DIAGNOSIS — B009 Herpesviral infection, unspecified: Secondary | ICD-10-CM

## 2017-09-05 DIAGNOSIS — R87810 Cervical high risk human papillomavirus (HPV) DNA test positive: Secondary | ICD-10-CM

## 2017-09-05 DIAGNOSIS — Z98891 History of uterine scar from previous surgery: Secondary | ICD-10-CM

## 2017-09-05 DIAGNOSIS — R87610 Atypical squamous cells of undetermined significance on cytologic smear of cervix (ASC-US): Secondary | ICD-10-CM

## 2017-09-05 DIAGNOSIS — Z3480 Encounter for supervision of other normal pregnancy, unspecified trimester: Secondary | ICD-10-CM

## 2017-09-05 MED ORDER — VALACYCLOVIR HCL 1 G PO TABS: 1000.0000 mg | ORAL_TABLET | Freq: Every day | ORAL | 7 refills | Status: DC | PRN

## 2017-09-05 NOTE — Progress Notes (Signed)
Patient ID: Cheryl Stewart, female   DOB: [birth year], 32 y.o.   MRN: [id number]  Chief Complaint  Patient presents with  . Wound Check    s/p c section 2 weeks ago.  Pt reports area still slightly tender, denies pain or drainage.    HPI Cheryl Stewart is a 32 y.o. female.   Had C-Section for repeat on 08/21/17.  Reports no complaints, desires Valtrex for HSV history. Hx of LGSIL/ASCUS with +HPV.  Colposcopy scheduled.  Reports bleeding/spotting.     HPI  Past Medical History:  Diagnosis Date  . Genital herpes   . LGSIL (low grade squamous intraepithelial dysplasia) 07/12  . Vaginal Pap smear, abnormal     Past Surgical History:  Procedure Laterality Date  . CESAREAN SECTION  2009  . CESAREAN SECTION N/A 04/22/2014   Procedure: CESAREAN SECTION;  Surgeon: Frederico Hamman, MD;  Location: [facility];  Service: Obstetrics;  Laterality: N/A;  . CESAREAN SECTION N/A 08/24/2016   Procedure: CESAREAN SECTION;  Surgeon: Florian Buff, MD;  Location: [facility];  Service: Obstetrics;  Laterality: N/A;  . CESAREAN SECTION WITH BILATERAL TUBAL LIGATION Bilateral 08/21/2017   Procedure: CESAREAN SECTION WITH BILATERAL TUBAL LIGATION;  Surgeon: Mora Bellman, MD;  Location: [facility];  Service: Obstetrics;  Laterality: Bilateral;  IUD ROMAL  . COLPOSCOPY  01/2011    Family History  Problem Relation Age of Onset  . Hypertension Father     Social History Social History   Tobacco Use  . Smoking status: Never Smoker  . Smokeless tobacco: Never Used  Substance Use Topics  . Alcohol use: No  . Drug use: No    No Known Allergies  Current Outpatient Medications  Medication Sig Dispense Refill  . valACYclovir (VALTREX) 1000 MG tablet Take 1 tablet (1,000 mg total) by mouth daily as needed (outbreaks). 30 tablet 7  . Vitamin D, Ergocalciferol, (DRISDOL) 50000 units CAPS capsule Take 1 capsule (50,000 Units total) by mouth every 7 (seven) days. (Patient taking differently: Take  50,000 Units by mouth every 7 (seven) days. On Sundays) 30 capsule 2  . ferrous sulfate 325 (65 FE) MG tablet Take 1 tablet (325 mg total) by mouth daily with breakfast. (Patient not taking: Reported on 09/05/2017) 30 tablet 3  . ibuprofen (ADVIL,MOTRIN) 600 MG tablet Take 1 tablet (600 mg total) by mouth every 6 (six) hours. (Patient not taking: Reported on 09/05/2017) 60 tablet 0  . oxyCODONE-acetaminophen (PERCOCET/ROXICET) 5-325 MG tablet Take 1 tablet by mouth every 4 (four) hours as needed for severe pain. (Patient not taking: Reported on 09/05/2017) 10 tablet 0   No current facility-administered medications for this visit.     Review of Systems Review of Systems Constitutional: negative for fatigue and weight loss Respiratory: negative for cough and wheezing Cardiovascular: negative for chest pain, fatigue and palpitations Gastrointestinal: negative for abdominal pain and change in bowel habits Genitourinary:+ Post-Op wound C/S wound  Integument/breast: negative for nipple discharge Musculoskeletal:negative for myalgias Neurological: negative for gait problems and tremors Behavioral/Psych: negative for abusive relationship, depression Endocrine: negative for temperature intolerance      Blood pressure 136/82, pulse 63, weight 202 lb 11.2 oz (91.9 kg), last menstrual period 12/08/2016, unknown if currently breastfeeding.  Physical Exam Physical Exam General:   alert  Skin:   no rash or abnormalities  Lungs:   clear to auscultation bilaterally  Heart:   regular rate and rhythm, S1, S2 normal, no murmur, click, rub or gallop  Breasts:  deferred  Abdomen:  normal findings: no organomegaly, soft, non-tender and no hernia C-Section wound:   Pelvis:  Deferred     50% of 15 min visit spent on counseling and coordination of care.    Data Reviewed Previous medical records, meds  Assessment     S/P C-section: wound doing well.  S/P BTL    Plan     Meds ordered this encounter   Medications  . valACYclovir (VALTREX) 1000 MG tablet    Sig: Take 1 tablet (1,000 mg total) by mouth daily as needed (outbreaks).    Dispense:  30 tablet    Refill:  7    Follow up in 4 weeks for postpartum exam and colposcopy.

## 2017-09-19 ENCOUNTER — Ambulatory Visit: Payer: Medicaid Other | Admitting: Certified Nurse Midwife

## 2017-09-29 ENCOUNTER — Other Ambulatory Visit: Payer: Self-pay

## 2017-09-29 DIAGNOSIS — B009 Herpesviral infection, unspecified: Secondary | ICD-10-CM

## 2017-09-29 MED ORDER — VALACYCLOVIR HCL 1 G PO TABS: 1000.0000 mg | ORAL_TABLET | Freq: Every day | ORAL | 7 refills | Status: DC | PRN

## 2017-10-03 ENCOUNTER — Ambulatory Visit (INDEPENDENT_AMBULATORY_CARE_PROVIDER_SITE_OTHER): Payer: Medicaid Other | Admitting: Obstetrics

## 2017-10-03 ENCOUNTER — Encounter: Payer: Self-pay | Admitting: Obstetrics

## 2017-10-03 DIAGNOSIS — Z1389 Encounter for screening for other disorder: Secondary | ICD-10-CM

## 2017-10-03 DIAGNOSIS — R87610 Atypical squamous cells of undetermined significance on cytologic smear of cervix (ASC-US): Secondary | ICD-10-CM

## 2017-10-03 DIAGNOSIS — R87810 Cervical high risk human papillomavirus (HPV) DNA test positive: Secondary | ICD-10-CM

## 2017-10-03 NOTE — Progress Notes (Signed)
Post Partum Exam  Cheryl Stewart is a 32 y.o. [phone] female who presents for a postpartum visit. She is 6 weeks postpartum following a Low Cervical Transverse Cesarean Section and Bilateral Tubal Sterilization. I have fully reviewed the prenatal and intrapartum course. The delivery was at 39.2 gestational weeks.  Anesthesia: epidural. Postpartum course has been good. Baby's course has been good. Baby is feeding by both breast and bottle - [facility] Good Start. Bleeding no bleeding. Bowel function is normal. Bladder function is normal. Patient is not sexually active. Contraception method is tubal ligation. Postpartum depression screening: SCORE 0  The following portions of the patient's history were reviewed and updated as appropriate: allergies, current medications, past family history, past medical history, past social history, past surgical history and problem list.  Review of Systems A comprehensive review of systems was negative.    Objective:  unknown if currently breastfeeding.  General:  alert and no distress   Breasts:  inspection negative, no nipple discharge or bleeding, no masses or nodularity palpable  Lungs: clear to auscultation bilaterally  Heart:  regular rate and rhythm, S1, S2 normal, no murmur, click, rub or gallop  Abdomen: soft, non-tender; bowel sounds normal; no masses,  no organomegaly   Vulva:  normal  Vagina: normal vagina  Cervix:  no cervical motion tenderness  Corpus: normal size, contour, position, consistency, mobility, non-tender  Adnexa:  no mass, fullness, tenderness  Rectal Exam: Not performed.        Assessment:     1. Postpartum care following cesarean delivery - doing well  2. ASCUS with positive high risk HPV cervical - Colposcopy scheduled   Plan:   1. Contraception: BTL done 2. Continue PNV's 3. Follow up in: 6 weeks for Colposcopy    Shelly Bombard MD

## 2017-11-14 ENCOUNTER — Encounter: Payer: Medicaid Other | Admitting: Obstetrics

## 2018-02-21 ENCOUNTER — Encounter (HOSPITAL_COMMUNITY): Payer: Self-pay

## 2018-05-11 ENCOUNTER — Ambulatory Visit: Payer: BLUE CROSS/BLUE SHIELD | Admitting: Obstetrics

## 2018-05-11 ENCOUNTER — Encounter: Payer: Self-pay | Admitting: Obstetrics

## 2018-05-11 VITALS — BP 124/85 | HR 69 | Wt 203.0 lb

## 2018-05-11 DIAGNOSIS — Z01419 Encounter for gynecological examination (general) (routine) without abnormal findings: Secondary | ICD-10-CM

## 2018-05-11 DIAGNOSIS — N898 Other specified noninflammatory disorders of vagina: Secondary | ICD-10-CM

## 2018-05-11 DIAGNOSIS — Z113 Encounter for screening for infections with a predominantly sexual mode of transmission: Secondary | ICD-10-CM | POA: Diagnosis not present

## 2018-05-11 DIAGNOSIS — R87810 Cervical high risk human papillomavirus (HPV) DNA test positive: Secondary | ICD-10-CM

## 2018-05-11 DIAGNOSIS — Z1151 Encounter for screening for human papillomavirus (HPV): Secondary | ICD-10-CM | POA: Diagnosis not present

## 2018-05-11 DIAGNOSIS — Z124 Encounter for screening for malignant neoplasm of cervix: Secondary | ICD-10-CM

## 2018-05-11 DIAGNOSIS — R87610 Atypical squamous cells of undetermined significance on cytologic smear of cervix (ASC-US): Secondary | ICD-10-CM

## 2018-05-11 NOTE — Progress Notes (Signed)
Subjective:        Cheryl Stewart is a 32 y.o. female here for a routine exam.  Current complaints: None..    Personal health questionnaire:  Is [name], have a family history of breast and/or ovarian cancer: no Is there a family history of uterine cancer diagnosed at age < 44, gastrointestinal cancer, urinary tract cancer, family member who is a Field seismologist syndrome-associated carrier: no Is the patient overweight and hypertensive, family history of diabetes, personal history of gestational diabetes, preeclampsia or PCOS: no Is patient over 42, have PCOS,  family history of premature CHD under age 81, diabetes, smoke, have hypertension or peripheral artery disease:  no At any time, has a partner hit, kicked or otherwise hurt or frightened you?: no Over the past 2 weeks, have you felt down, depressed or hopeless?: no Over the past 2 weeks, have you felt little interest or pleasure in doing things?:no   Gynecologic History Patient's last menstrual period was 04/30/2018. Contraception: tubal ligation Last Pap: 2018. Results were: ASCUS with positive HPV Last mammogram: n/a. Results were: n/a  Obstetric History OB History  Gravida Para Term Preterm AB Living  6 4 4   2 4   SAB TAB Ectopic Multiple Live Births    2   0 4    # Outcome Date GA Lbr Len/2nd Weight Sex Delivery Anes PTL Lv  6 Term 08/21/17 [redacted]w[redacted]d  8 lb 6.9 oz (3.825 kg) M CS-LTranv Spinal  LIV  5 Term 08/24/16 [redacted]w[redacted]d  7 lb 11.6 oz (3.505 kg) M CS-LTranv Spinal  LIV  4 Term 04/22/14 [redacted]w[redacted]d 14:15 / 03:22 8 lb 3.9 oz (3.74 kg) M CS-LTranv EPI  LIV  3 Term 11/10/07   8 lb 2 oz (3.685 kg) F CS-Unspec EPI N LIV     Complications: Failure to Progress in First Stage  2 TAB           1 TAB             Past Medical History:  Diagnosis Date  . Genital herpes   . LGSIL (low grade squamous intraepithelial dysplasia) 07/12  . Vaginal Pap smear, abnormal     Past Surgical History:  Procedure Laterality Date  .  CESAREAN SECTION  2009  . CESAREAN SECTION N/A 04/22/2014   Procedure: CESAREAN SECTION;  Surgeon: Frederico Hamman, MD;  Location: [facility];  Service: Obstetrics;  Laterality: N/A;  . CESAREAN SECTION N/A 08/24/2016   Procedure: CESAREAN SECTION;  Surgeon: Florian Buff, MD;  Location: [facility];  Service: Obstetrics;  Laterality: N/A;  . CESAREAN SECTION WITH BILATERAL TUBAL LIGATION Bilateral 08/21/2017   Procedure: CESAREAN SECTION WITH BILATERAL TUBAL LIGATION;  Surgeon: Mora Bellman, MD;  Location: [facility];  Service: Obstetrics;  Laterality: Bilateral;  IUD ROMAL  . COLPOSCOPY  01/2011     Current Outpatient Medications:  .  valACYclovir (VALTREX) 1000 MG tablet, Take 1 tablet (1,000 mg total) by mouth daily as needed (outbreaks)., Disp: 30 tablet, Rfl: 7 .  ibuprofen (ADVIL,MOTRIN) 600 MG tablet, Take 1 tablet (600 mg total) by mouth every 6 (six) hours. (Patient not taking: Reported on 09/05/2017), Disp: 60 tablet, Rfl: 0 No Known Allergies  Social History   Tobacco Use  . Smoking status: Never Smoker  . Smokeless tobacco: Never Used  Substance Use Topics  . Alcohol use: No    Family History  Problem Relation Age of Onset  . Hypertension Father  Review of Systems  Constitutional: negative for fatigue and weight loss Respiratory: negative for cough and wheezing Cardiovascular: negative for chest pain, fatigue and palpitations Gastrointestinal: negative for abdominal pain and change in bowel habits Musculoskeletal:negative for myalgias Neurological: negative for gait problems and tremors Behavioral/Psych: negative for abusive relationship, depression Endocrine: negative for temperature intolerance    Genitourinary:negative for abnormal menstrual periods, genital lesions, hot flashes, sexual problems and vaginal discharge Integument/breast: negative for breast lump, breast tenderness, nipple discharge and skin lesion(s)    Objective:       BP  124/85   Pulse 69   Wt 203 lb (92.1 kg)   LMP 04/30/2018   BMI 37.13 kg/m  General:   alert  Skin:   no rash or abnormalities  Lungs:   clear to auscultation bilaterally  Heart:   regular rate and rhythm, S1, S2 normal, no murmur, click, rub or gallop  Breasts:   normal without suspicious masses, skin or nipple changes or axillary nodes  Abdomen:  normal findings: no organomegaly, soft, non-tender and no hernia  Pelvis:  External genitalia: normal general appearance Urinary system: urethral meatus normal and bladder without fullness, nontender Vaginal: normal without tenderness, induration or masses Cervix: normal appearance Adnexa: normal bimanual exam Uterus: anteverted and non-tender, normal size   Lab Review Urine pregnancy test Labs reviewed yes Radiologic studies reviewed no  50% of 20 min visit spent on counseling and coordination of care.   Assessment:     1. Encounter for routine gynecological examination with Papanicolaou smear of cervix  2. Vaginal discharge Rx: - Cervicovaginal ancillary only  3. ASCUS with positive high risk HPV cervical Rx: - Cytology - PAP    Plan:    Education reviewed: calcium supplements, depression evaluation, low fat, low cholesterol diet, safe sex/STD prevention, self breast exams and weight bearing exercise. Follow up in: 6 months.   No orders of the defined types were placed in this encounter.  No orders of the defined types were placed in this encounter.   Shelly Bombard MD 05-11-2018

## 2018-05-14 LAB — CERVICOVAGINAL ANCILLARY ONLY
Bacterial vaginitis: NEGATIVE
Candida vaginitis: NEGATIVE
Chlamydia: NEGATIVE
Neisseria Gonorrhea: NEGATIVE
Trichomonas: NEGATIVE

## 2018-05-15 LAB — CYTOLOGY - PAP
Diagnosis: UNDETERMINED — AB
HPV: NOT DETECTED

## 2018-07-30 DIAGNOSIS — R5383 Other fatigue: Secondary | ICD-10-CM | POA: Diagnosis not present

## 2018-07-30 DIAGNOSIS — Z0000 Encounter for general adult medical examination without abnormal findings: Secondary | ICD-10-CM | POA: Diagnosis not present

## 2018-07-30 DIAGNOSIS — R1084 Generalized abdominal pain: Secondary | ICD-10-CM | POA: Diagnosis not present

## 2018-08-10 DIAGNOSIS — R1031 Right lower quadrant pain: Secondary | ICD-10-CM | POA: Diagnosis not present

## 2018-08-10 DIAGNOSIS — R1084 Generalized abdominal pain: Secondary | ICD-10-CM | POA: Diagnosis not present

## 2018-08-17 DIAGNOSIS — R1084 Generalized abdominal pain: Secondary | ICD-10-CM | POA: Diagnosis not present

## 2018-08-17 DIAGNOSIS — D219 Benign neoplasm of connective and other soft tissue, unspecified: Secondary | ICD-10-CM | POA: Diagnosis not present

## 2018-08-17 DIAGNOSIS — Z711 Person with feared health complaint in whom no diagnosis is made: Secondary | ICD-10-CM | POA: Diagnosis not present

## 2018-12-26 ENCOUNTER — Telehealth: Payer: Self-pay | Admitting: *Deleted

## 2018-12-26 NOTE — Telephone Encounter (Signed)
Pt called to office rquesting rx, did not leave name of medication.  Attempt to return call.  No answer, LM on VM to call office.

## 2018-12-27 ENCOUNTER — Other Ambulatory Visit: Payer: Self-pay | Admitting: *Deleted

## 2018-12-27 DIAGNOSIS — B009 Herpesviral infection, unspecified: Secondary | ICD-10-CM

## 2018-12-27 MED ORDER — VALACYCLOVIR HCL 1 G PO TABS: 1000.0000 mg | ORAL_TABLET | Freq: Every day | ORAL | 7 refills | Status: DC | PRN

## 2018-12-27 NOTE — Progress Notes (Signed)
Refill request for Valtrex sent to pharmacy.

## 2019-04-06 IMAGING — US US OB COMP LESS 14 WK
1 series · 15 of 28 positions shown · non-contrast
Comparison: None.

CLINICAL DATA: Positive pregnancy test. IUD in place. Unknown LMP.

EXAM:
OBSTETRIC <14 WK US AND TRANSVAGINAL OB US
TECHNIQUE: Both transabdominal and transvaginal ultrasound examinations were
performed for complete evaluation of the gestation as well as the
maternal uterus, adnexal regions, and pelvic cul-de-sac.
Transvaginal technique was performed to assess early pregnancy.

[Series 1: us ob comp less 14 wk · 15 of 68 slices shown]
[im 1/68]
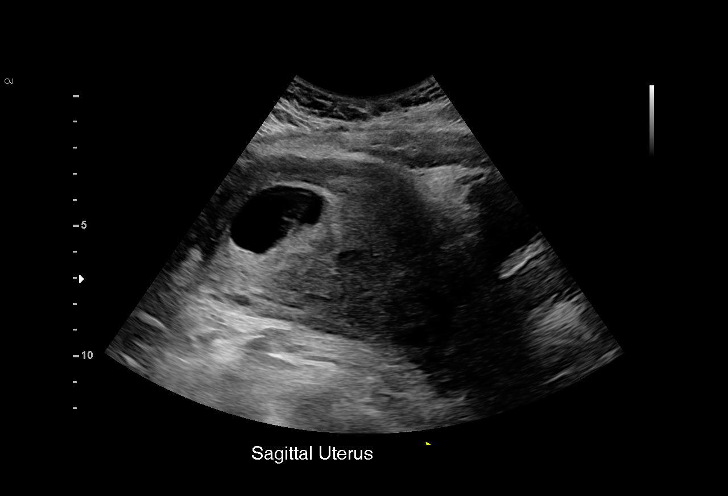
[im 5/68]
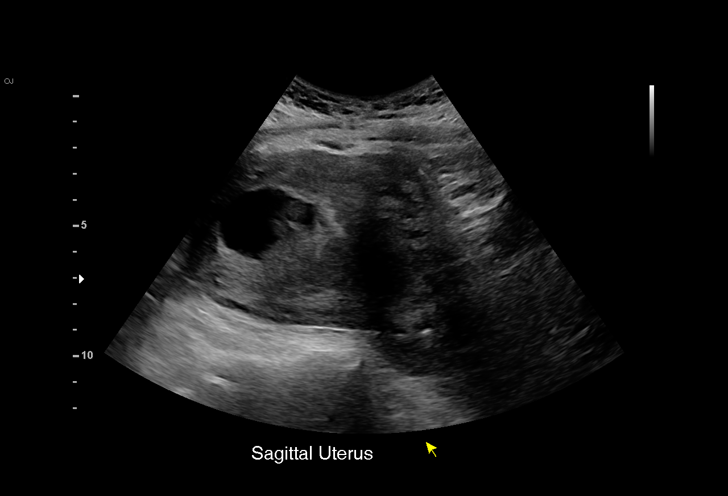
[im 10/68]
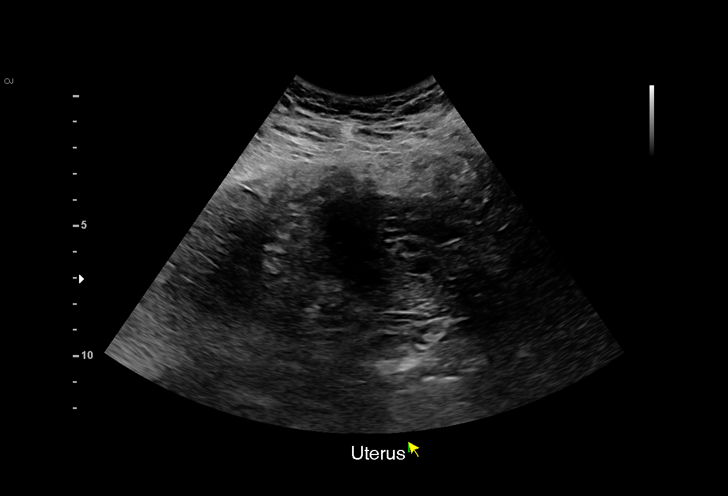
[im 15/68]
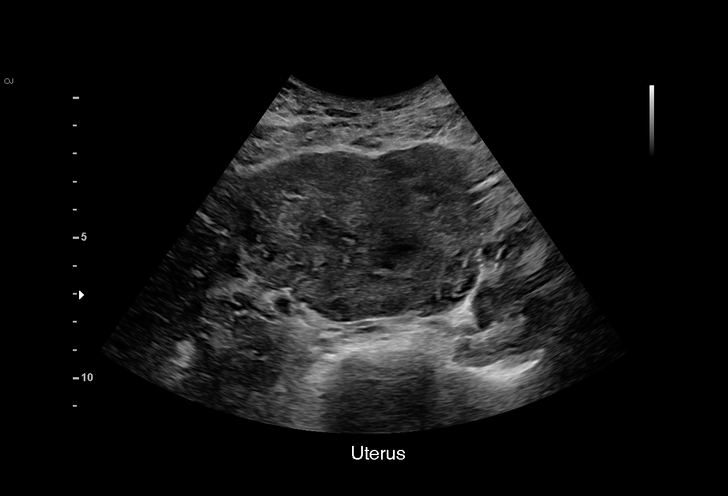
[im 20/68]
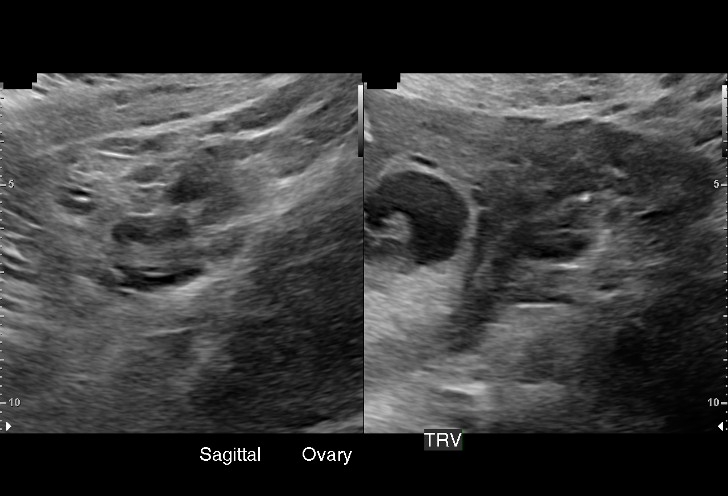
[im 25/68]
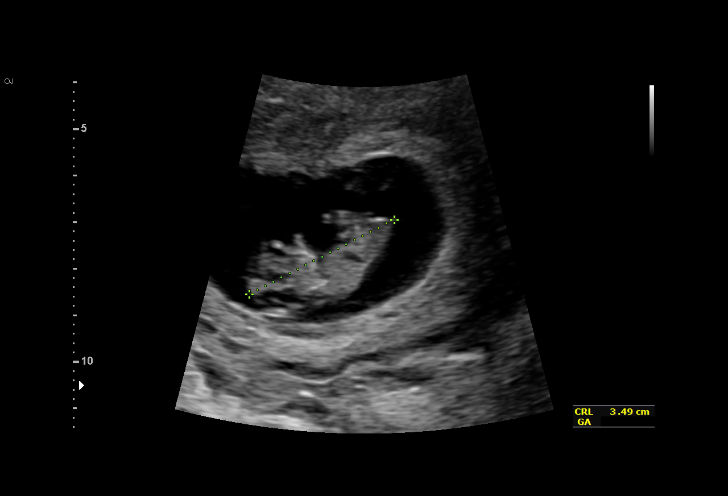
[im 30/68]
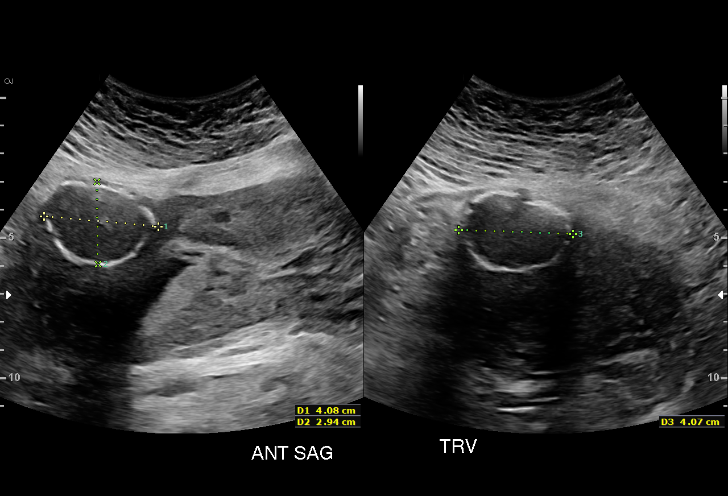
[im 35/68]
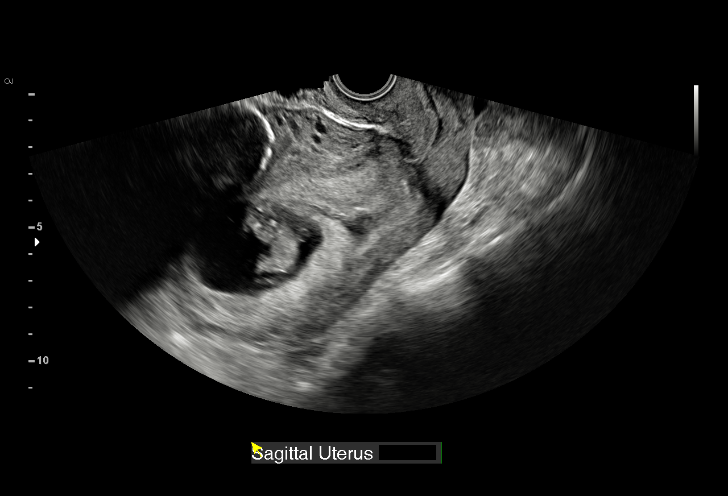
[im 38/68]
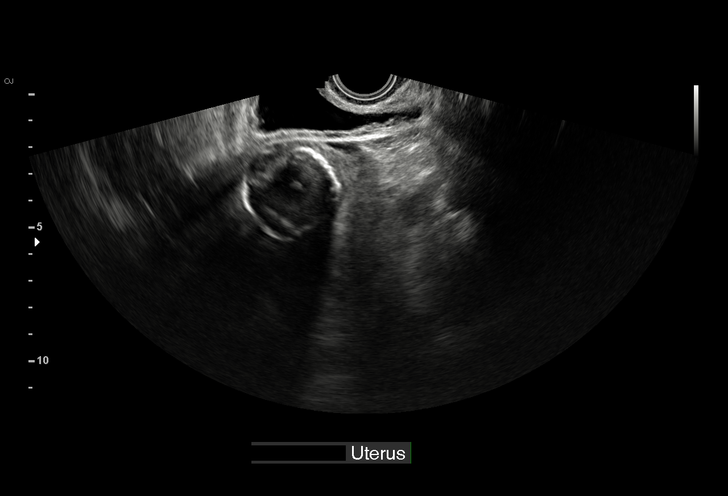
[im 43/68]
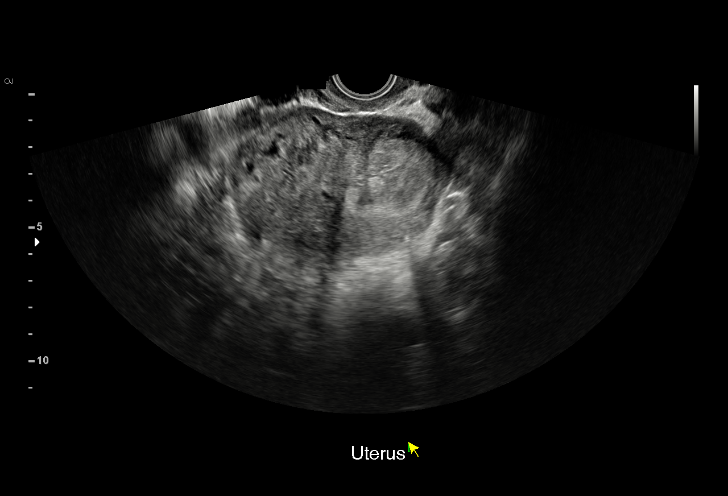
[im 48/68]
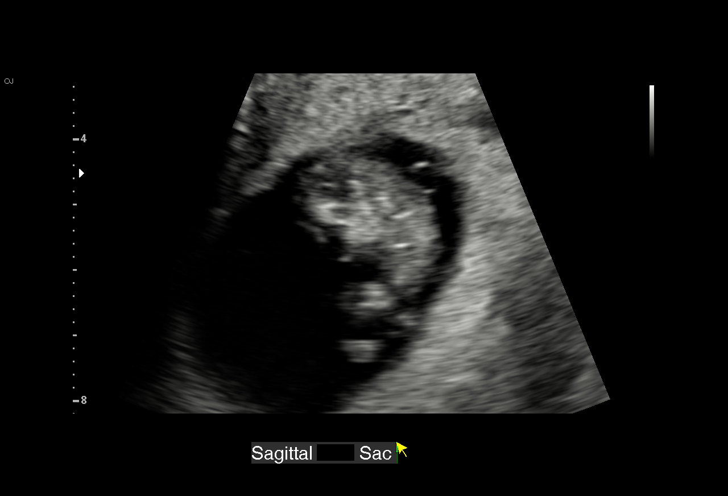
[im 53/68]
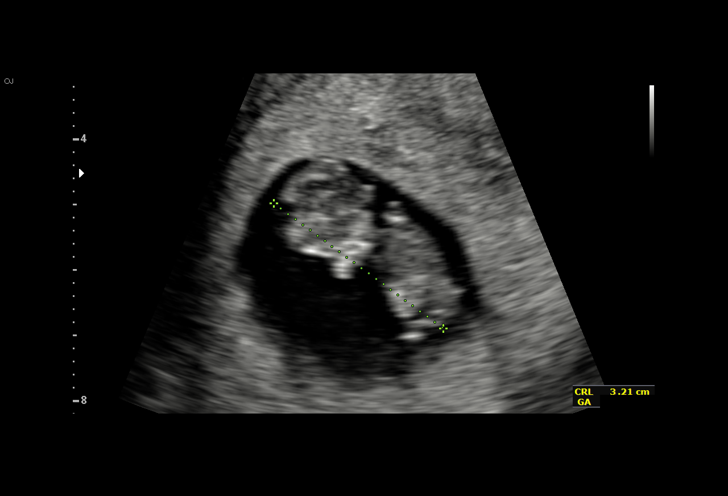
[im 58/68]
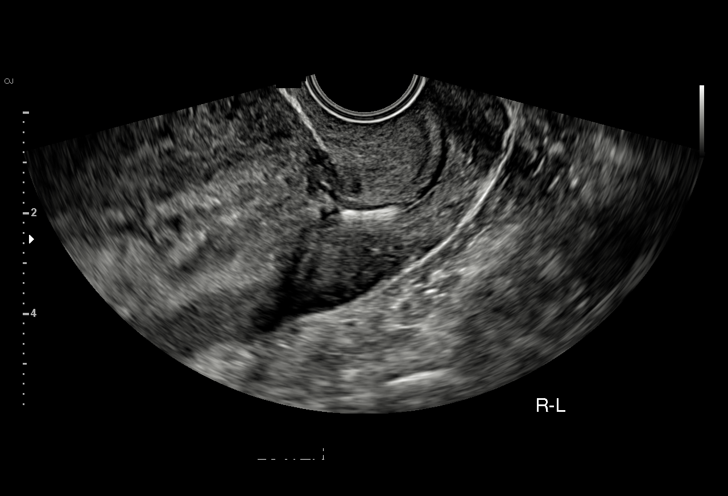
[im 63/68]
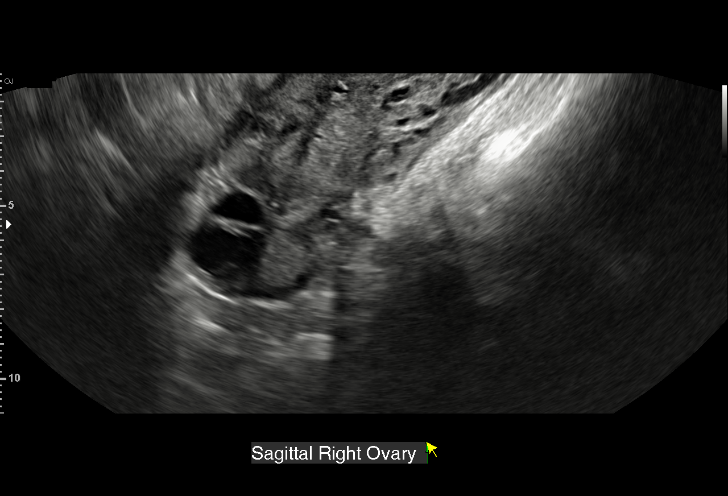
[im 68/68]
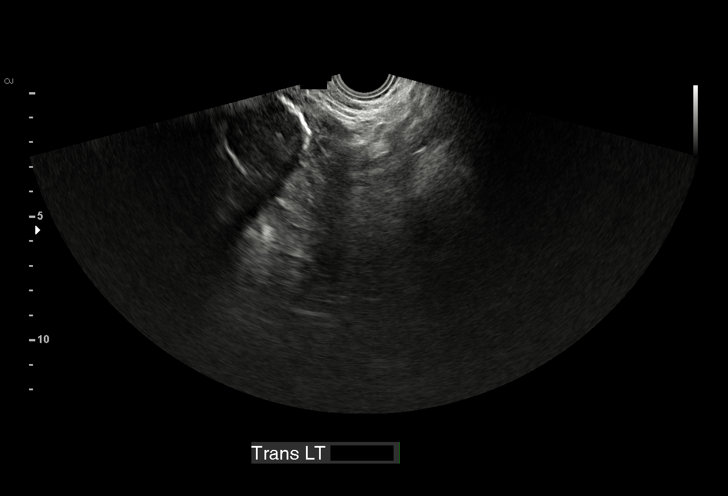

[15 of 28 positions shown; findings below may reference images not displayed]

FINDINGS: Intrauterine gestational sac: Single

Yolk sac:  Visualized.

Embryo:  Visualized.

Cardiac Activity: Visualized.

Heart Rate: 159  bpm

CRL:  31  mm   9 w   6 d                  US EDC: 08/25/2017

Subchorionic hemorrhage: Small subchorionic hemorrhage noted in the
lower uterine segment.

Maternal uterus/adnexae: An IUD is visualized in the lower uterine
segment below the level of the IUP. A small calcified fibroid is
seen in the anterior uterine corpus which measures 3.9 cm in maximum
diameter both ovaries are normal in appearance. No adnexal mass or
free fluid identified.
IMPRESSION: Single living IUP measuring 9 weeks 6 days, with US EDC of
08/25/2017.

IUD and small subchorionic hemorrhage in lower uterine segment,
below the level of the IUP.

3.9 cm anterior calcified uterine fibroid.

## 2019-06-19 IMAGING — US US MFM OB COMP +14 WKS
1 series · 14 of 28 positions shown · non-contrast
Comparison: none

[Series 1: us mfm ob comp +14 wks · 14 of 110 slices shown]
[im 5/110]
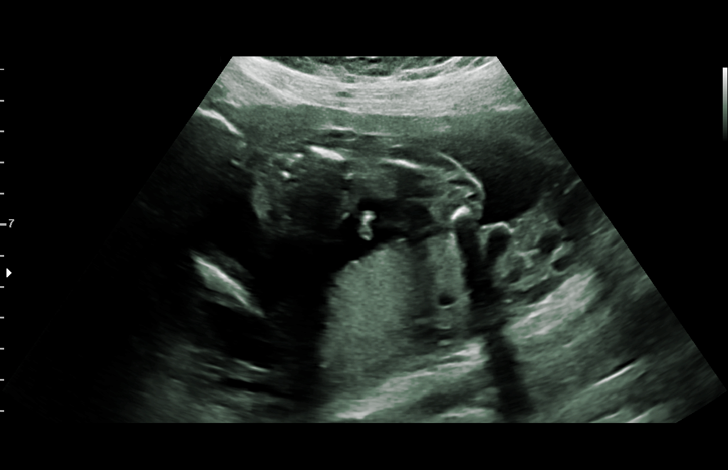
[im 13/110]
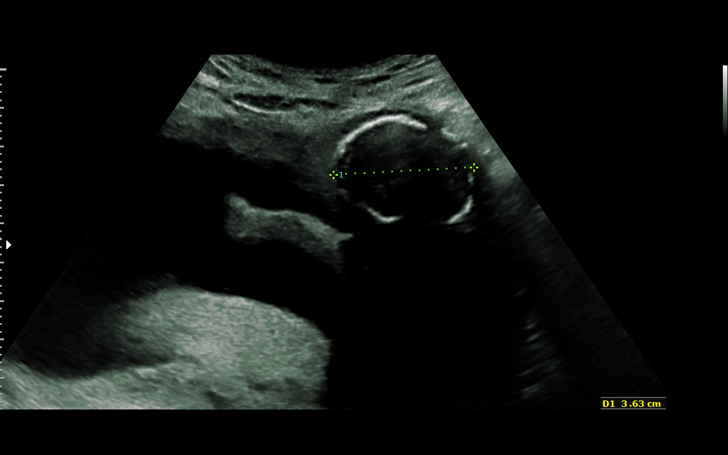
[im 21/110]
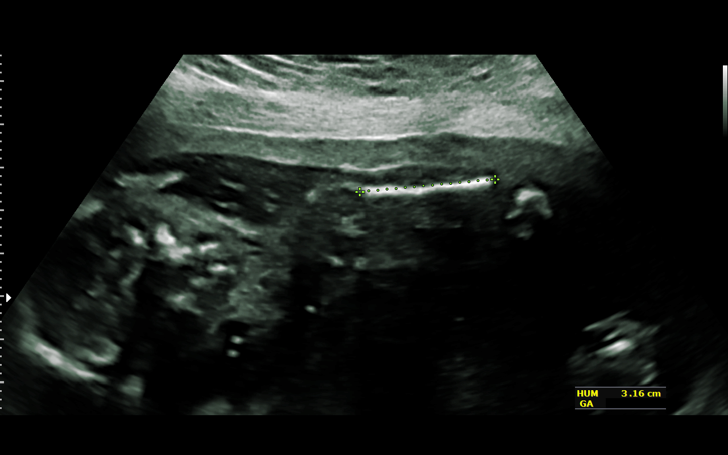
[im 29/110]
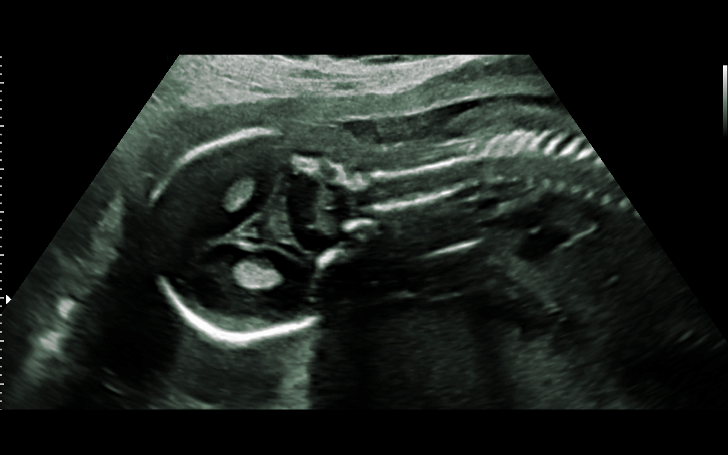
[im 37/110]
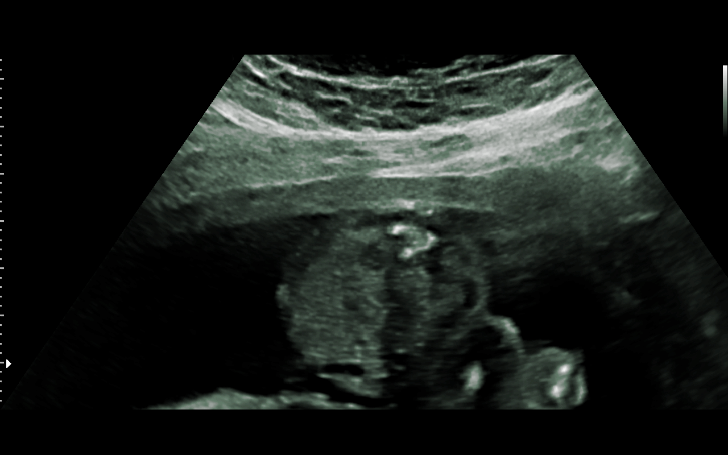
[im 45/110]
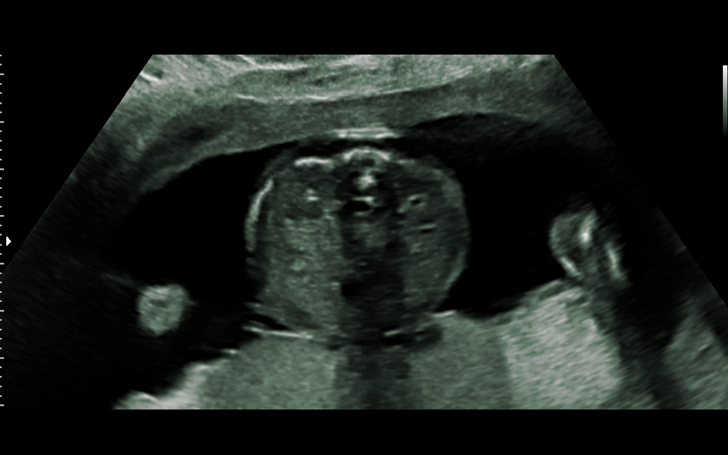
[im 53/110]
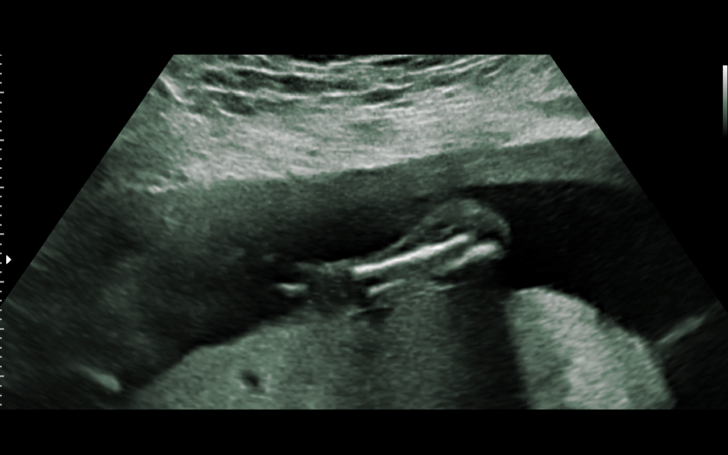
[im 61/110]
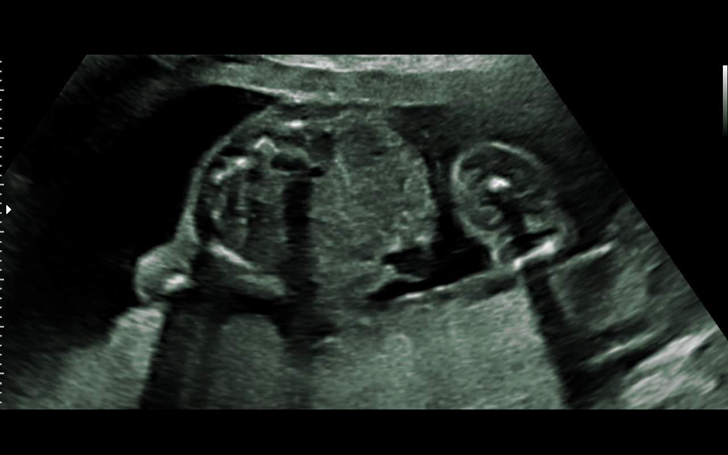
[im 69/110]
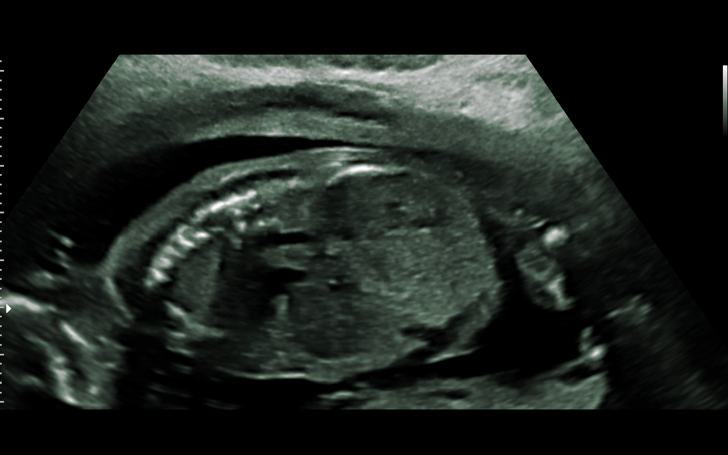
[im 77/110]
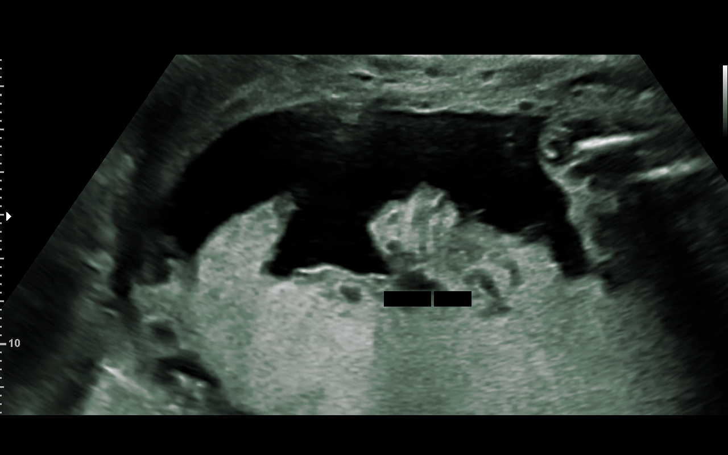
[im 85/110]
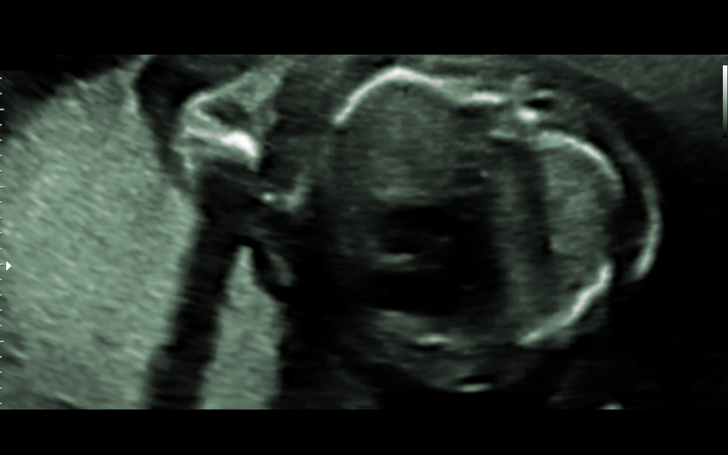
[im 93/110]
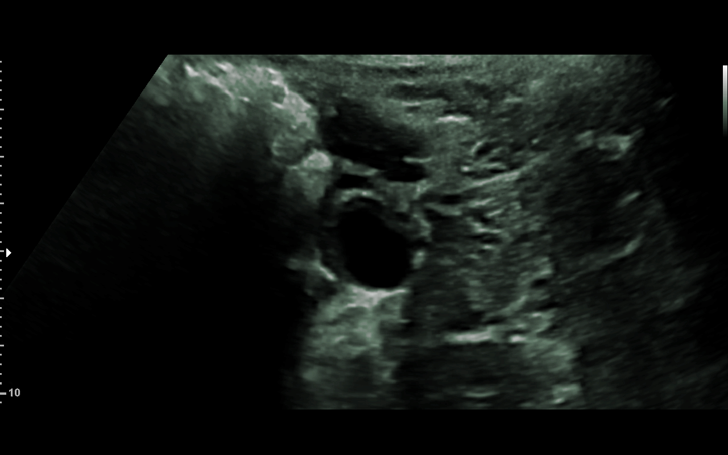
[im 101/110]
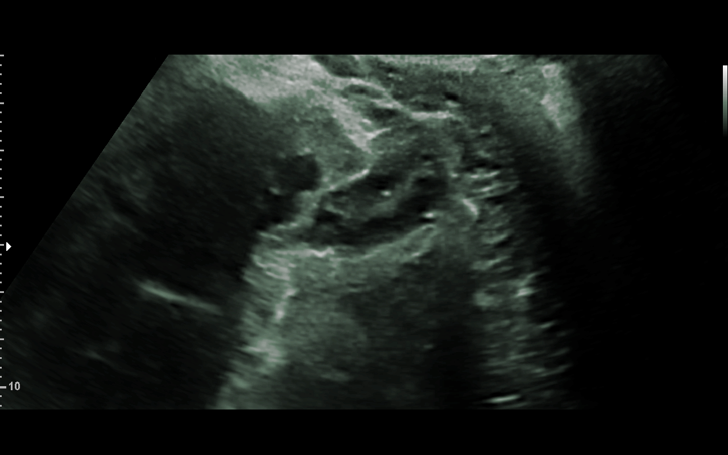
[im 110/110]
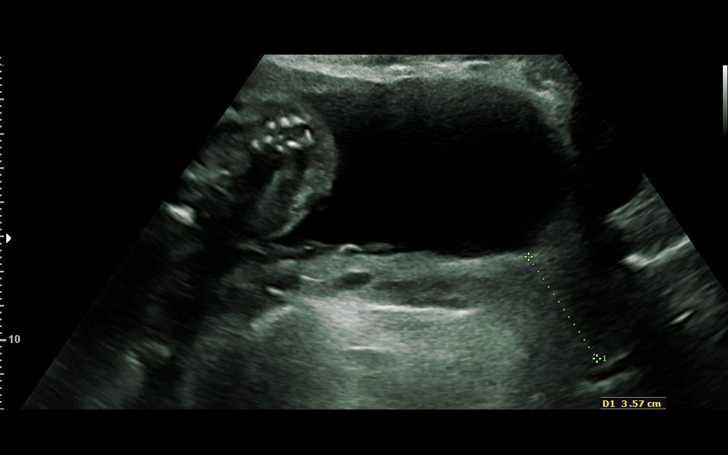

[14 of 28 positions shown; findings below may reference images not displayed]

[REDACTED]care -
[HOSPITAL]([HOSPITAL]
)

1  CHUCKIE SAGAR           [id number]      [id number]     [id number]
Indications

20 weeks gestation of pregnancy
Encounter for antenatal screening for
malformations
History of cesarean delivery, currently
pregnant x 3
Uterine fibroids
OB History

Gravidity:    6         Term:   3
TOP:          2        Living:  3
Fetal Evaluation

Num Of Fetuses:     1
Fetal Heart         133
Rate(bpm):
Cardiac Activity:   Observed
Presentation:       Transverse, head to maternal right
Placenta:           Posterior, above cervical os
P. Cord Insertion:  Not well visualized

Amniotic Fluid
AFI FV:      Subjectively within normal limits

Largest Pocket(cm)
6.2
Biometry

BPD:        49  mm     G. Age:  20w 6d         66  %    CI:        76.19   %   70 - 86
FL/HC:      18.9   %   16.8 -
HC:      177.9  mm     G. Age:  20w 2d         34  %    HC/AC:      1.16       1.09 -
AC:       154   mm     G. Age:  20w 4d         49  %    FL/BPD:     68.6   %
FL:       33.6  mm     G. Age:  20w 4d         46  %    FL/AC:      21.8   %   20 - 24
HUM:      31.4  mm     G. Age:  20w 3d         51  %
CER:      21.7  mm     G. Age:  20w 5d         54  %
LV:        5.6  mm
CM:        3.3  mm

Est. FW:     361  gm    0 lb 13 oz      50  %
Gestational Age

LMP:           17w 4d       Date:   12/08/16                 EDD:   09/14/17
U/S Today:     20w 4d                                        EDD:   08/24/17
Best:          20w 3d    Det. By:   Early Ultrasound         EDD:   08/25/17
Anatomy

Cranium:               Appears normal         Aortic Arch:            Appears normal
Cavum:                 Appears normal         Ductal Arch:            Appears normal
Ventricles:            Appears normal         Diaphragm:              Appears normal
Choroid Plexus:        Appears normal         Stomach:                Appears normal, left
sided
Cerebellum:            Appears normal         Abdomen:                Appears normal
Posterior Fossa:       Appears normal         Abdominal Wall:         Appears nml (cord
insert, abd wall)
Nuchal Fold:           Appears normal         Cord Vessels:           Appears normal (3
vessel cord)
Face:                  Appears normal         Kidneys:                Appear normal
(orbits and profile)
Lips:                  Appears normal         Bladder:                Appears normal
Thoracic:              Appears normal         Spine:                  Appears normal
Heart:                 Not well visualized    Upper Extremities:      Appears normal
RVOT:                  Appears normal         Lower Extremities:      Appears normal
LVOT:                  Appears normal

Other:  Male gender. Nasal bone visualized. Heels and 5th digit visualized.
Technically difficult due to fetal position.
Cervix Uterus Adnexa

Cervix
Length:            3.6  cm.
Normal appearance by transabdominal scan.

Left Ovary
Within normal limits.

Right Ovary
Simple cyst, measuring 2cm.
Myomas

Site                     L(cm)      W(cm)      D(cm)      Location
Anterior                 4.3        3          3.6        Subserosal
Blood Flow                 RI        PI       Comments
Calcific rim
Impression

Singleton intrauterine pregnancy at 20+3 weeks, here for
anatomic survey
Review of the anatomy shows no sonographic markers for
aneuploidy or structural anomalies
However, the cardiac evaluation should be considered
suboptimal secondary to fetal position
Amniotic fluid volume is normal
Estimated fetal weight is 361g which is growth in the 50th
percentile
There is a small subserosal fibroid noted anteriorly that does
not diatort the uterine cavity
Recommendations

Recommend repeat scan in 4 weeks to reassess fibroid for
changes and complete anatomic survey

## 2020-05-06 ENCOUNTER — Other Ambulatory Visit: Payer: Self-pay

## 2020-05-06 MED ORDER — VALACYCLOVIR HCL 500 MG PO TABS: 500.0000 mg | ORAL_TABLET | Freq: Two times a day (BID) | ORAL | 3 refills | Status: DC

## 2020-05-06 NOTE — Telephone Encounter (Signed)
Patient is requesting a refill on Valtrex. I have advised I can call in a refill but she is over due for annual exam. Patient will call back to schedule appointment tomorrow.

## 2020-05-13 ENCOUNTER — Encounter: Payer: Self-pay | Admitting: Obstetrics

## 2020-05-13 ENCOUNTER — Ambulatory Visit (INDEPENDENT_AMBULATORY_CARE_PROVIDER_SITE_OTHER): Payer: 59 | Admitting: Obstetrics

## 2020-05-13 ENCOUNTER — Other Ambulatory Visit: Payer: Self-pay

## 2020-05-13 ENCOUNTER — Other Ambulatory Visit (HOSPITAL_COMMUNITY)
Admission: RE | Admit: 2020-05-13 | Discharge: 2020-05-13 | Disposition: A | Discharge status: Home / Self Care | Payer: No Typology Code available for payment source | Source: Ambulatory Visit | Attending: Obstetrics | Admitting: Obstetrics

## 2020-05-13 VITALS — BP 103/64 | HR 64 | Ht 62.0 in | Wt 159.1 lb

## 2020-05-13 DIAGNOSIS — N898 Other specified noninflammatory disorders of vagina: Secondary | ICD-10-CM | POA: Diagnosis not present

## 2020-05-13 DIAGNOSIS — Z01419 Encounter for gynecological examination (general) (routine) without abnormal findings: Secondary | ICD-10-CM | POA: Diagnosis not present

## 2020-05-13 DIAGNOSIS — N3000 Acute cystitis without hematuria: Secondary | ICD-10-CM | POA: Diagnosis not present

## 2020-05-13 MED ORDER — OXYCODONE-ACETAMINOPHEN 5-325 MG PO TABS: 1.0000 | ORAL_TABLET | ORAL | 0 refills | Status: DC | PRN

## 2020-05-13 MED ORDER — CEFUROXIME AXETIL 500 MG PO TABS: 500.0000 mg | ORAL_TABLET | Freq: Two times a day (BID) | ORAL | 1 refills | Status: DC

## 2020-05-13 MED ORDER — IBUPROFEN 800 MG PO TABS: 800.0000 mg | ORAL_TABLET | Freq: Three times a day (TID) | ORAL | 5 refills | Status: DC | PRN

## 2020-05-13 MED ORDER — PRENATAL PLUS 27-1 MG PO TABS: 1.0000 | ORAL_TABLET | Freq: Every day | ORAL | 11 refills | Status: DC

## 2020-05-13 NOTE — Progress Notes (Signed)
Subjective:        Cheryl Stewart is a 34 y.o. female here for a routine exam.  Current complaints: None.    Personal health questionnaire:  Is patient Cheryl Stewart, have a family history of breast and/or ovarian cancer: no Is there a family history of uterine cancer diagnosed at age < 93, gastrointestinal cancer, urinary tract cancer, family member who is a Field seismologist syndrome-associated carrier: no Is the patient overweight and hypertensive, family history of diabetes, personal history of gestational diabetes, preeclampsia or PCOS: no Is patient over 24, have PCOS,  family history of premature CHD under age 44, diabetes, smoke, have hypertension or peripheral artery disease:  no At any time, has a partner hit, kicked or otherwise hurt or frightened you?: no Over the past 2 weeks, have you felt down, depressed or hopeless?: no Over the past 2 weeks, have you felt little interest or pleasure in doing things?:no   Gynecologic History Patient's last menstrual period was 05/01/2020. Contraception: tubal ligation Last Pap: 05-11-2018. Results were: ASCUS Last mammogram: n/a. Results were: n/a  Obstetric History OB History  Gravida Para Term Preterm AB Living  6 4 4   2 4   SAB TAB Ectopic Multiple Live Births    2   0 4    # Outcome Date GA Lbr Len/2nd Weight Sex Delivery Anes PTL Lv  6 Term 08/21/17 [redacted]w[redacted]d  8 lb 6.9 oz (3.825 kg) M CS-LTranv Spinal  LIV  5 Term 08/24/16 [redacted]w[redacted]d  7 lb 11.6 oz (3.505 kg) M CS-LTranv Spinal  LIV  4 Term 04/22/14 [redacted]w[redacted]d 14:15 / 03:22 8 lb 3.9 oz (3.74 kg) M CS-LTranv EPI  LIV  3 Term 11/10/07   8 lb 2 oz (3.685 kg) F CS-Unspec EPI N LIV     Complications: Failure to Progress in First Stage  2 TAB           1 TAB             Past Medical History:  Diagnosis Date  . Genital herpes   . LGSIL (low grade squamous intraepithelial dysplasia) 07/12  . Vaginal Pap smear, abnormal     Past Surgical History:  Procedure Laterality Date  . CESAREAN SECTION   2009  . CESAREAN SECTION N/A 04/22/2014   Procedure: CESAREAN SECTION;  Surgeon: Cheryl Hamman, MD;  Location: [facility];  Service: Obstetrics;  Laterality: N/A;  . CESAREAN SECTION N/A 08/24/2016   Procedure: CESAREAN SECTION;  Surgeon: Cheryl Buff, MD;  Location: [facility];  Service: Obstetrics;  Laterality: N/A;  . CESAREAN SECTION WITH BILATERAL TUBAL LIGATION Bilateral 08/21/2017   Procedure: CESAREAN SECTION WITH BILATERAL TUBAL LIGATION;  Surgeon: Cheryl Bellman, MD;  Location: [facility];  Service: Obstetrics;  Laterality: Bilateral;  IUD ROMAL  . COLPOSCOPY  01/2011     Current Outpatient Medications:  .  valACYclovir (VALTREX) 500 MG tablet, Take 1 tablet (500 mg total) by mouth 2 (two) times daily., Disp: 30 tablet, Rfl: 3 .  ibuprofen (ADVIL,MOTRIN) 600 MG tablet, Take 1 tablet (600 mg total) by mouth every 6 (six) hours. (Patient not taking: Reported on 09/05/2017), Disp: 60 tablet, Rfl: 0 .  valACYclovir (VALTREX) 1000 MG tablet, Take 1 tablet (1,000 mg total) by mouth daily as needed (outbreaks). (Patient not taking: Reported on 05/13/2020), Disp: 30 tablet, Rfl: 7 No Known Allergies  Social History   Tobacco Use  . Smoking status: Never Smoker  . Smokeless tobacco: Never Used  Substance Use Topics  . Alcohol use: No    Family History  Problem Relation Age of Onset  . Hypertension Father       Review of Systems  Constitutional: negative for fatigue and weight loss Respiratory: negative for cough and wheezing Cardiovascular: negative for chest pain, fatigue and palpitations Gastrointestinal: negative for abdominal pain and change in bowel habits Musculoskeletal:negative for myalgias Neurological: negative for gait problems and tremors Behavioral/Psych: negative for abusive relationship, depression Endocrine: negative for temperature intolerance    Genitourinary:negative for abnormal menstrual periods, genital lesions, hot flashes, sexual problems  and vaginal discharge Integument/breast: negative for breast lump, breast tenderness, nipple discharge and skin lesion(s)    Objective:       BP 103/64   Pulse 64   Ht 5\' 2"  (1.575 m)   Wt 159 lb 1.6 oz (72.2 kg)   LMP 05/01/2020   BMI 29.10 kg/m  General:   alert and no distress  Skin:   no rash or abnormalities  Lungs:   clear to auscultation bilaterally  Heart:   regular rate and rhythm, S1, S2 normal, no murmur, click, rub or gallop  Breasts:   normal without suspicious masses, skin or nipple changes or axillary nodes  Abdomen:  normal findings: no organomegaly, soft, non-tender and no hernia  Pelvis:  External genitalia: normal general appearance Urinary system: urethral meatus normal and bladder without fullness, nontender Vaginal: normal without tenderness, induration or masses Cervix: normal appearance Adnexa: normal bimanual exam Uterus: anteverted and non-tender, normal size   Lab Review Urine pregnancy test Labs reviewed yes Radiologic studies reviewed no  50% of 20 min visit spent on counseling and coordination of care.   Assessment:     1. Encounter for gynecological examination with Papanicolaou smear of cervix Rx: - Cytology - PAP( [facility])  2. Vaginal discharge Rx: - Cervicovaginal ancillary only( [facility])    Plan:    Education reviewed: calcium supplements, depression evaluation, low fat, low cholesterol diet, safe sex/STD prevention, self breast exams and weight bearing exercise. Follow up in: 1 year.   Meds ordered this encounter  Medications  . DISCONTD: ibuprofen (ADVIL) 800 MG tablet    Sig: Take 1 tablet (800 mg total) by mouth every 8 (eight) hours as needed.    Dispense:  30 tablet    Refill:  5  . DISCONTD: cefUROXime (CEFTIN) 500 MG tablet    Sig: Take 1 tablet (500 mg total) by mouth 2 (two) times daily with a meal.    Dispense:  14 tablet    Refill:  1  . DISCONTD: oxyCODONE-acetaminophen (PERCOCET/ROXICET) 5-325 MG  tablet    Sig: Take 1 tablet by mouth every 4 (four) hours as needed for severe pain.    Dispense:  20 tablet    Refill:  0  . DISCONTD: prenatal vitamin w/FE, FA (PRENATAL 1 + 1) 27-1 MG TABS tablet    Sig: Take 1 tablet by mouth daily before breakfast.    Dispense:  30 tablet    Refill:  11   Orders Placed This Encounter  Procedures  . Urine Culture    Shelly Bombard, MD 05/13/2020 4:01 PM

## 2020-05-13 NOTE — Progress Notes (Signed)
Pt is in the office for annual.  Last pap 05-11-18 LMP 05-01-20 Vaginal discharge Hx of BTL

## 2020-05-14 ENCOUNTER — Other Ambulatory Visit: Payer: Self-pay | Admitting: Obstetrics

## 2020-05-14 DIAGNOSIS — B3731 Acute candidiasis of vulva and vagina: Secondary | ICD-10-CM

## 2020-05-14 DIAGNOSIS — B373 Candidiasis of vulva and vagina: Secondary | ICD-10-CM

## 2020-05-14 LAB — CERVICOVAGINAL ANCILLARY ONLY
Bacterial Vaginitis (gardnerella): NEGATIVE
Candida Glabrata: NEGATIVE
Candida Vaginitis: POSITIVE — AB
Chlamydia: NEGATIVE
Comment: NEGATIVE
Comment: NEGATIVE
Comment: NEGATIVE
Comment: NEGATIVE
Comment: NEGATIVE
Comment: NORMAL
Neisseria Gonorrhea: NEGATIVE
Trichomonas: NEGATIVE

## 2020-05-14 LAB — CYTOLOGY - PAP
Comment: NEGATIVE
Diagnosis: NEGATIVE
High risk HPV: NEGATIVE

## 2020-05-14 MED ORDER — FLUCONAZOLE 150 MG PO TABS: 150.0000 mg | ORAL_TABLET | Freq: Once | ORAL | 0 refills | Status: AC

## 2020-10-30 ENCOUNTER — Other Ambulatory Visit: Payer: Self-pay | Admitting: Obstetrics

## 2021-12-15 ENCOUNTER — Ambulatory Visit: Payer: No Typology Code available for payment source | Admitting: Obstetrics

## 2022-01-25 ENCOUNTER — Ambulatory Visit: Payer: No Typology Code available for payment source | Admitting: Obstetrics and Gynecology

## 2022-01-25 ENCOUNTER — Ambulatory Visit: Payer: No Typology Code available for payment source | Admitting: Obstetrics

## 2022-01-27 ENCOUNTER — Telehealth: Payer: Self-pay

## 2022-01-27 MED ORDER — VALACYCLOVIR HCL 500 MG PO TABS: 500.0000 mg | ORAL_TABLET | Freq: Two times a day (BID) | ORAL | 1 refills | Status: DC

## 2022-01-27 NOTE — Telephone Encounter (Signed)
Returned call, pt states that she is having a breakout and wants to refill rx, had to reschedule her annual until August, rx sent per protocol.

## 2022-02-15 ENCOUNTER — Other Ambulatory Visit: Payer: Self-pay | Admitting: Emergency Medicine

## 2022-02-15 MED ORDER — VALACYCLOVIR HCL 500 MG PO TABS: 500.0000 mg | ORAL_TABLET | Freq: Two times a day (BID) | ORAL | 1 refills | Status: DC

## 2022-02-15 NOTE — Progress Notes (Signed)
Valtrex reordered and sent to correct pharmacy.

## 2022-03-10 ENCOUNTER — Telehealth: Payer: Self-pay

## 2022-03-16 ENCOUNTER — Other Ambulatory Visit: Payer: Self-pay

## 2022-03-16 ENCOUNTER — Ambulatory Visit: Payer: No Typology Code available for payment source | Admitting: Student

## 2022-03-16 DIAGNOSIS — A6000 Herpesviral infection of urogenital system, unspecified: Secondary | ICD-10-CM

## 2022-03-16 MED ORDER — VALACYCLOVIR HCL 500 MG PO TABS: 500.0000 mg | ORAL_TABLET | Freq: Two times a day (BID) | ORAL | 2 refills | Status: DC

## 2022-03-30 ENCOUNTER — Other Ambulatory Visit: Payer: Self-pay | Admitting: Obstetrics and Gynecology

## 2022-03-30 DIAGNOSIS — A6000 Herpesviral infection of urogenital system, unspecified: Secondary | ICD-10-CM

## 2022-04-14 NOTE — Progress Notes (Signed)
Appointment canceled/patient no-show.

## 2022-05-23 ENCOUNTER — Ambulatory Visit: Payer: No Typology Code available for payment source | Admitting: Advanced Practice Midwife

## 2022-07-07 ENCOUNTER — Encounter: Payer: Self-pay | Admitting: Obstetrics

## 2022-07-07 ENCOUNTER — Ambulatory Visit (INDEPENDENT_AMBULATORY_CARE_PROVIDER_SITE_OTHER): Payer: Commercial Managed Care - PPO | Admitting: Obstetrics

## 2022-07-07 VITALS — BP 107/68 | HR 69 | Ht 62.0 in | Wt 169.0 lb

## 2022-07-07 DIAGNOSIS — D251 Intramural leiomyoma of uterus: Secondary | ICD-10-CM | POA: Diagnosis not present

## 2022-07-07 DIAGNOSIS — E669 Obesity, unspecified: Secondary | ICD-10-CM

## 2022-07-07 DIAGNOSIS — A6000 Herpesviral infection of urogenital system, unspecified: Secondary | ICD-10-CM

## 2022-07-07 DIAGNOSIS — Z0000 Encounter for general adult medical examination without abnormal findings: Secondary | ICD-10-CM

## 2022-07-07 DIAGNOSIS — N946 Dysmenorrhea, unspecified: Secondary | ICD-10-CM | POA: Diagnosis not present

## 2022-07-07 DIAGNOSIS — N939 Abnormal uterine and vaginal bleeding, unspecified: Secondary | ICD-10-CM

## 2022-07-07 MED ORDER — IBUPROFEN 800 MG PO TABS: 800.0000 mg | ORAL_TABLET | Freq: Three times a day (TID) | ORAL | 5 refills | Status: DC | PRN

## 2022-07-07 MED ORDER — VALACYCLOVIR HCL 500 MG PO TABS: 500.0000 mg | ORAL_TABLET | Freq: Two times a day (BID) | ORAL | 5 refills | Status: DC

## 2022-07-07 NOTE — Progress Notes (Signed)
Patient ID: Cheryl Stewart, female   DOB: 01/18/1986, 36 y.o.   MRN: [id number]  Chief Complaint  Patient presents with   Gynecologic Exam    HPI Cheryl Stewart is a 36 y.o. female.  Complains of having periods twice a month, that are every 21 days.  Denies any bleeding in between periods, vaginal discharge or pelvic pain.  She has a history of small intramural fibroids. HPI  Past Medical History:  Diagnosis Date   Genital herpes    LGSIL (low grade squamous intraepithelial dysplasia) 07/12   Vaginal Pap smear, abnormal     Past Surgical History:  Procedure Laterality Date   CESAREAN SECTION  2009   CESAREAN SECTION N/A 04/22/2014   Procedure: CESAREAN SECTION;  Surgeon: Frederico Hamman, MD;  Location: [facility];  Service: Obstetrics;  Laterality: N/A;   CESAREAN SECTION N/A 08/24/2016   Procedure: CESAREAN SECTION;  Surgeon: Florian Buff, MD;  Location: [facility];  Service: Obstetrics;  Laterality: N/A;   CESAREAN SECTION WITH BILATERAL TUBAL LIGATION Bilateral 08/21/2017   Procedure: CESAREAN SECTION WITH BILATERAL TUBAL LIGATION;  Surgeon: Mora Bellman, MD;  Location: [facility];  Service: Obstetrics;  Laterality: Bilateral;  IUD ROMAL   COLPOSCOPY  01/2011    Family History  Problem Relation Age of Onset   Hypertension Father     Social History Social History   Tobacco Use   Smoking status: Never   Smokeless tobacco: Never  Substance Use Topics   Alcohol use: No   Drug use: No    No Known Allergies  Current Outpatient Medications  Medication Sig Dispense Refill   ibuprofen (ADVIL) 800 MG tablet Take 1 tablet (800 mg total) by mouth every 8 (eight) hours as needed. 30 tablet 5   ibuprofen (ADVIL,MOTRIN) 600 MG tablet Take 1 tablet (600 mg total) by mouth every 6 (six) hours. (Patient not taking: Reported on 09/05/2017) 60 tablet 0   valACYclovir (VALTREX) 1000 MG tablet Take 1 tablet (1,000 mg total) by mouth daily as needed (outbreaks). (Patient  not taking: Reported on 05/13/2020) 30 tablet 7   valACYclovir (VALTREX) 500 MG tablet Take 1 tablet (500 mg total) by mouth 2 (two) times daily. 30 tablet 5   No current facility-administered medications for this visit.    Review of Systems Review of Systems Constitutional: negative for fatigue and weight loss Respiratory: negative for cough and wheezing Cardiovascular: negative for chest pain, fatigue and palpitations Gastrointestinal: negative for abdominal pain and change in bowel habits Genitourinary:positive for periods twice a month - every 21 days Integument/breast: negative for nipple discharge Musculoskeletal:negative for myalgias Neurological: negative for gait problems and tremors Behavioral/Psych: negative for abusive relationship, depression Endocrine: negative for temperature intolerance      Blood pressure 107/68, pulse 69, height '5\' 2"'$  (1.575 m), weight 169 lb (76.7 kg), last menstrual period 07/04/2022, unknown if currently breastfeeding.  Physical Exam Physical Exam General:   Alert and no distress  Skin:   no rash or abnormalities  Lungs:   clear to auscultation bilaterally  Heart:   regular rate and rhythm, S1, S2 normal, no murmur, click, rub or gallop  Breasts:   normal without suspicious masses, skin or nipple changes or axillary nodes  Abdomen:  normal findings: no organomegaly, soft, non-tender and no hernia   Pelvic Exam:  Deferred due to menstrual bleeding  Data Reviewed Labs Ultrasound  Assessment     1. Abnormal uterine bleeding.  Oligomenorrhea. Rx: - CBC -  TSH - Ferritin  2. Fibroids, intramural Rx: - US PELVIC COMPLETE WITH TRANSVAGINAL; Future  3. Dysmenorrhea Rx: - ibuprofen (ADVIL) 800 MG tablet; Take 1 tablet (800 mg total) by mouth every 8 (eight) hours as needed.  Dispense: 30 tablet; Refill: 5  4. Obesity (BMI 30.0-34.9) Rx: - Hemoglobin A1c  5. Genital herpes simplex, unspecified site Rx: - valACYclovir (VALTREX) 500 MG  tablet; Take 1 tablet (500 mg total) by mouth 2 (two) times daily.  Dispense: 30 tablet; Refill: 5  6. Healthcare maintenance Rx: - Ambulatory referral to Internal Medicine     Plan   Follow up in 2 weeks  Orders Placed This Encounter  Procedures   US PELVIC COMPLETE WITH TRANSVAGINAL    Standing Status:   Future    Standing Expiration Date:   07/08/2023    Order Specific Question:   Reason for Exam (SYMPTOM  OR DIAGNOSIS REQUIRED)    Answer:   A.  Uterine fibroids.    Order Specific Question:   Preferred imaging location?    Answer:   [id number]   Hemoglobin A1c   CBC   TSH   Ferritin   Ambulatory referral to Internal Medicine    Referral Priority:   Routine    Referral Type:   Consultation    Referral Reason:   Specialty Services Required    Requested Specialty:   Internal Medicine    Number of Visits Requested:   1   Meds ordered this encounter  Medications   valACYclovir (VALTREX) 500 MG tablet    Sig: Take 1 tablet (500 mg total) by mouth 2 (two) times daily.    Dispense:  30 tablet    Refill:  5   ibuprofen (ADVIL) 800 MG tablet    Sig: Take 1 tablet (800 mg total) by mouth every 8 (eight) hours as needed.    Dispense:  30 tablet    Refill:  5    Cheryl Bombard, MD 07/07/2022 4:00 PM

## 2022-07-07 NOTE — Progress Notes (Signed)
36 y.o. GYN presents for AUB, c/o of having 2 periods in a month on 06/05/22 and 06/26/22.  She wants to know if the Fibroids got bigger?  Pt will schedule AEX for a later date

## 2022-07-13 ENCOUNTER — Ambulatory Visit: Admission: RE | Admit: 2022-07-13 | Payer: Commercial Managed Care - PPO | Source: Ambulatory Visit

## 2022-07-26 ENCOUNTER — Encounter: Payer: Self-pay | Admitting: Family Medicine

## 2022-07-26 ENCOUNTER — Ambulatory Visit (INDEPENDENT_AMBULATORY_CARE_PROVIDER_SITE_OTHER): Payer: Commercial Managed Care - PPO | Admitting: Family Medicine

## 2022-07-26 VITALS — BP 120/72 | HR 86 | Temp 97.6°F | Ht 62.5 in | Wt 190.0 lb

## 2022-07-26 DIAGNOSIS — Z13220 Encounter for screening for lipoid disorders: Secondary | ICD-10-CM | POA: Diagnosis not present

## 2022-07-26 DIAGNOSIS — Z6834 Body mass index (BMI) 34.0-34.9, adult: Secondary | ICD-10-CM

## 2022-07-26 DIAGNOSIS — R7989 Other specified abnormal findings of blood chemistry: Secondary | ICD-10-CM

## 2022-07-26 DIAGNOSIS — K5909 Other constipation: Secondary | ICD-10-CM | POA: Diagnosis not present

## 2022-07-26 DIAGNOSIS — E66812 Obesity, class 2: Secondary | ICD-10-CM | POA: Insufficient documentation

## 2022-07-26 DIAGNOSIS — E6609 Other obesity due to excess calories: Secondary | ICD-10-CM

## 2022-07-26 DIAGNOSIS — Z1159 Encounter for screening for other viral diseases: Secondary | ICD-10-CM | POA: Diagnosis not present

## 2022-07-26 DIAGNOSIS — Z862 Personal history of diseases of the blood and blood-forming organs and certain disorders involving the immune mechanism: Secondary | ICD-10-CM | POA: Diagnosis not present

## 2022-07-26 DIAGNOSIS — A6000 Herpesviral infection of urogenital system, unspecified: Secondary | ICD-10-CM

## 2022-07-26 LAB — CBC
HCT: 36.8 % (ref 36.0–46.0)
Hemoglobin: 12.3 g/dL (ref 12.0–15.0)
MCHC: 33.6 g/dL (ref 30.0–36.0)
MCV: 82 fl (ref 78.0–100.0)
Platelets: 187 10*3/uL (ref 150.0–400.0)
RBC: 4.49 Mil/uL (ref 3.87–5.11)
RDW: 15.8 % — ABNORMAL HIGH (ref 11.5–15.5)
WBC: 3.5 10*3/uL — ABNORMAL LOW (ref 4.0–10.5)

## 2022-07-26 LAB — LIPID PANEL
Cholesterol: 236 mg/dL — ABNORMAL HIGH (ref 0–200)
HDL: 77.4 mg/dL (ref >39.00)
LDL Cholesterol: 146 mg/dL — ABNORMAL HIGH (ref 0–99)
NonHDL: 158.28
Total CHOL/HDL Ratio: 3
Triglycerides: 61 mg/dL (ref 0.0–149.0)
VLDL: 12.2 mg/dL (ref 0.0–40.0)

## 2022-07-26 LAB — TSH: TSH: 0.22 u[IU]/mL — ABNORMAL LOW (ref 0.35–5.50)

## 2022-07-26 LAB — HEMOGLOBIN A1C: Hgb A1c MFr Bld: 5.6 % (ref 4.6–6.5)

## 2022-07-26 MED ORDER — WEGOVY 0.25 MG/0.5ML ~~LOC~~ SOAJ: 0.2500 mg | SUBCUTANEOUS | 0 refills | Status: DC

## 2022-07-26 NOTE — Addendum Note (Signed)
Addended by: Haydee Salter on: 07/26/2022 11:46 AM   Modules accepted: Level of Service

## 2022-07-26 NOTE — Addendum Note (Signed)
Addended by: Haydee Salter on: 07/26/2022 02:38 PM   Modules accepted: Orders

## 2022-07-26 NOTE — Progress Notes (Signed)
[facility] [address] [place] [address] Dept: [phone] Dept Fax: [phone]  New Patient Office Visit  Subjective:    Patient ID: Cheryl Stewart, female    DOB: [birth year], 36 y.o..   MRN: [id number]  Chief Complaint  Patient presents with   Establish Care    NP-establish care   weight management.      History of Present Illness:  Patient is in today to establish care. Cheryl Stewart was born in [place], [place]. Her family moved to [place] in 2000. She attended  [employer], majoring in social work with a minor in mental health. 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 She has been married for 7 years. She has 4 children (14, 8,5,4). She denies use of tobacco, alcohol, or drugs.  Cheryl Stewart has a history of genital herpes. It has been quite some time since her last outbreak. She uses valacyclovir when needed for this.  Cheryl Stewart notes struggles she has with her weight. Her weight was up to 233 with her last pregnancy. She notes she was able to lose down to 142 lbs. afterwards. This came about through a low carb diet, regular exercise, and use of phentermine. She had been maintaining this weight loss, but notes recently significant weight gain. She feels this has been due to excessive eating. She feels like the phentermine did help to "jump start" her weight loss previously. She does not want to go back on this, but did want to consider an adjunct medication.  Cheryl Stewart also notes a lifelong issue with constipation. She notes she ash had to repeatedly use a "colon cleanse" product to have bowel movements.  Past Medical History: Patient Active Problem List   Diagnosis Date Noted   Class 1 obesity due to excess calories with body mass index (BMI) of 34.0 to 34.9 in adult 07/26/2022   Chronic constipation 07/26/2022   Genital herpes 07/26/2022    ASCUS of cervix with negative high risk HPV 08/21/2017   S/P repeat low transverse C-section 08/25/2016   History of syphilis 04/06/2016   Uterine fibroid 03/14/2016   Past Surgical History:  Procedure Laterality Date   CESAREAN SECTION  2009   CESAREAN SECTION N/A 04/22/2014   Procedure: CESAREAN SECTION;  Surgeon: Frederico Hamman, MD;  Location: [facility];  Service: Obstetrics;  Laterality: N/A;   CESAREAN SECTION N/A 08/24/2016   Procedure: CESAREAN SECTION;  Surgeon: Florian Buff, MD;  Location: [facility];  Service: Obstetrics;  Laterality: N/A;   CESAREAN SECTION WITH BILATERAL TUBAL LIGATION Bilateral 08/21/2017   Procedure: CESAREAN SECTION WITH BILATERAL TUBAL LIGATION;  Surgeon: Mora Bellman, MD;  Location: [facility];  Service: Obstetrics;  Laterality: Bilateral;  IUD ROMAL   COLPOSCOPY  01/2011   Family History  Problem Relation Age of Onset   Diabetes Mother    Hypertension Father    Outpatient Medications Prior to Visit  Medication Sig Dispense Refill   valACYclovir (VALTREX) 500 MG tablet Take 1 tablet (500 mg total) by mouth 2 (two) times daily. 30 tablet 5   ibuprofen (ADVIL) 800 MG tablet Take 1 tablet (800 mg total) by mouth every 8 (eight) hours as needed. 30 tablet 5   ibuprofen (ADVIL,MOTRIN) 600 MG tablet Take 1 tablet (600 mg total) by mouth every 6 (six) hours. (Patient not taking: Reported on 09/05/2017) 60 tablet 0   valACYclovir (  VALTREX) 1000 MG tablet Take 1 tablet (1,000 mg total) by mouth daily as needed (outbreaks). (Patient not taking: Reported on 05/13/2020) 30 tablet 7   No facility-administered medications prior to visit.   No Known Allergies    Objective:   Today's Vitals   07/26/22 0933  BP: 120/72  Pulse: 86  Temp: 97.6 F (36.4 C)  TempSrc: Temporal  SpO2: 99%  Weight: 190 lb (86.2 kg)  Height: 5' 2.5" (1.588 m)   Body mass index is 34.2 kg/m.   General: Well developed, well nourished. No acute distress. Psych:  Alert and oriented. Normal mood and affect.  Health Maintenance Due  Topic Date Due   Hepatitis C Screening  Never done     Assessment & Plan:   1. Class 1 obesity due to excess calories without serious comorbidity with body mass index (BMI) of 34.0 to 34.9 in adult I will check some screening labs related to potential causes of weight gain and for co-morbidities of her obesity. I strongly advised a foundation of a healthy diet and regular exercise for achieving a healthy weight and maintaining gains. We will try prescribing Wegovy as an adjunct. I would like to see her back in 5 weeks to reassess.  - TSH - Hemoglobin A1c - Semaglutide-Weight Management (WEGOVY) 0.25 MG/0.5ML SOAJ; Inject 0.25 mg into the skin once a week.  Dispense: 2 mL; Refill: 0  2. Chronic constipation I recommend she maintain adequate fiber intake and hydration. She can use periodic Miralax as needed. I will check her TSH in light of the weight gain and constipation issue.  - TSH  3. Encounter for hepatitis C screening test for low risk patient  - HCV Ab w Reflex to Quant PCR  4. Genital herpes simplex, unspecified site Stable. Continue PRN valacyclovir.  5. Screening for lipid disorders  - Lipid panel  6. History of anemia  - CBC  Return in about 5 weeks (around 08/30/2022).   Haydee Salter, MD

## 2022-07-27 ENCOUNTER — Telehealth: Payer: Self-pay | Admitting: Family Medicine

## 2022-07-27 LAB — HCV INTERPRETATION

## 2022-07-27 LAB — HCV AB W REFLEX TO QUANT PCR: HCV Ab: NONREACTIVE

## 2022-07-27 LAB — T3, FREE: T3, Free: 4 pg/mL (ref 2.3–4.2)

## 2022-07-27 LAB — T4, FREE: Free T4: 0.92 ng/dL (ref 0.60–1.60)

## 2022-07-27 NOTE — Telephone Encounter (Signed)
Caller Name: Schwanda Zima Call back phone #: [phone]  Reason for Call: Pt was prescribed Wegovy, pharmacy reached out to her and told her she would need a PA

## 2022-07-27 NOTE — Addendum Note (Signed)
Addended by: Adora Fridge on: 07/27/2022 08:52 AM   Modules accepted: Orders

## 2022-07-28 ENCOUNTER — Telehealth: Payer: Self-pay

## 2022-07-28 ENCOUNTER — Other Ambulatory Visit (HOSPITAL_COMMUNITY): Payer: Self-pay

## 2022-07-28 DIAGNOSIS — Z6834 Body mass index (BMI) 34.0-34.9, adult: Secondary | ICD-10-CM

## 2022-07-28 NOTE — Telephone Encounter (Signed)
Pharmacy Patient Advocate Encounter  Received notification from [facility] [employer] that the request for prior authorization for Wegovy 0.'25MG'$ /0.5ML auto-injectors has been denied due to:] .   Key: [id number] How would you like to proceed?  Please be advised appeals may take up to 5 business days to be submitted as pharmacist prepares necessary documentation.  Thank you!

## 2022-07-28 NOTE — Telephone Encounter (Signed)
Pharmacy Patient Advocate Encounter   Received notification from [facility] that prior authorization for Wegovy 0.'25MG'$ /0.5ML auto-injectors is required/requested.   PA submitted on 07/28/22 to (ins) [employer] via CoverMyMeds Key [id number]  Status is pending

## 2022-08-02 ENCOUNTER — Ambulatory Visit: Payer: Commercial Managed Care - PPO | Admitting: Family Medicine

## 2022-08-02 NOTE — Telephone Encounter (Signed)
Lft VM to rtn call. Dm/cma  

## 2022-08-02 NOTE — Telephone Encounter (Signed)
Patient notified VIA phone and would like a referral to the weight clinic.  She is also going to check to see if she can pay for the [facility] out of pocket.  Dm/cma

## 2022-08-02 NOTE — Addendum Note (Signed)
Addended by: Haydee Salter on: 08/02/2022 05:17 PM   Modules accepted: Orders

## 2022-08-04 ENCOUNTER — Inpatient Hospital Stay: Admission: RE | Admit: 2022-08-04 | Payer: Commercial Managed Care - PPO | Source: Ambulatory Visit

## 2022-08-10 ENCOUNTER — Telehealth: Payer: Self-pay | Admitting: Family Medicine

## 2022-08-10 NOTE — Telephone Encounter (Signed)
Pt said dr prescribed her expensive medication that she was going to pay out of pocked but she did not and want to know if there is cheaper med you can put her in

## 2022-08-15 NOTE — Telephone Encounter (Signed)
Lft VM to rtn call. Marland Kitchendmc

## 2022-08-17 NOTE — Telephone Encounter (Signed)
Lft VM to rtn call. Marland Kitchendmc

## 2022-08-18 NOTE — Telephone Encounter (Signed)
Lft VM to rtn call. Marland Kitchendmc

## 2022-08-25 ENCOUNTER — Ambulatory Visit: Payer: Commercial Managed Care - PPO | Admitting: Family Medicine

## 2022-08-30 ENCOUNTER — Ambulatory Visit: Payer: Commercial Managed Care - PPO | Admitting: Family Medicine

## 2022-10-01 ENCOUNTER — Encounter: Payer: Self-pay | Admitting: Emergency Medicine

## 2022-10-01 ENCOUNTER — Ambulatory Visit
Admission: EM | Admit: 2022-10-01 | Discharge: 2022-10-01 | Disposition: A | Discharge status: Home / Self Care | Payer: Commercial Managed Care - PPO | Attending: Internal Medicine | Admitting: Internal Medicine

## 2022-10-01 DIAGNOSIS — J069 Acute upper respiratory infection, unspecified: Secondary | ICD-10-CM | POA: Insufficient documentation

## 2022-10-01 DIAGNOSIS — R300 Dysuria: Secondary | ICD-10-CM | POA: Insufficient documentation

## 2022-10-01 DIAGNOSIS — N898 Other specified noninflammatory disorders of vagina: Secondary | ICD-10-CM

## 2022-10-01 LAB — POCT URINALYSIS DIP (MANUAL ENTRY)
Glucose, UA: 100 mg/dL — AB
Leukocytes, UA: NEGATIVE
Nitrite, UA: POSITIVE — AB
Protein Ur, POC: 30 mg/dL — AB
Spec Grav, UA: >=1.03 — AB (ref 1.010–1.025)
Urobilinogen, UA: 0.2 E.U./dL
pH, UA: 5.5 (ref 5.0–8.0)

## 2022-10-01 MED ORDER — BENZONATATE 100 MG PO CAPS: ORAL_CAPSULE | ORAL | 0 refills | Status: DC

## 2022-10-01 NOTE — ED Triage Notes (Signed)
Fever, productive cough, sore throat, weakness x 1 week, believes she's improving from this. Following that she began having dysuria, vaginal itching, urinary frequency, chills, new clear vaginal discharge.

## 2022-10-01 NOTE — ED Provider Notes (Signed)
[facility]    CSN: [id number] Arrival date & time: 10/01/22  1153      History   Chief Complaint Chief Complaint  Patient presents with   Dysuria    HPI JENNIAH LACOE is a 37 y.o. female.    Dysuria   Presents with report of fever, productive cough, sore throat, weakness x 1 week and states that the symptoms are improving.  She complains today of developing dysuria, vaginal itching, urinary frequency, chills, clear vaginal discharge which started a few days ago.  Past Medical History:  Diagnosis Date   Genital herpes    LGSIL (low grade squamous intraepithelial dysplasia) 07/12   Vaginal Pap smear, abnormal     Patient Active Problem List   Diagnosis Date Noted   Class 1 obesity due to excess calories with body mass index (BMI) of 34.0 to 34.9 in adult 07/26/2022   Chronic constipation 07/26/2022   Genital herpes 07/26/2022   ASCUS of cervix with negative high risk HPV 08/21/2017   S/P repeat low transverse C-section 08/25/2016   History of syphilis 04/06/2016   Uterine fibroid 03/14/2016    Past Surgical History:  Procedure Laterality Date   CESAREAN SECTION  2009   CESAREAN SECTION N/A 04/22/2014   Procedure: CESAREAN SECTION;  Surgeon: Frederico Hamman, MD;  Location: [facility];  Service: Obstetrics;  Laterality: N/A;   CESAREAN SECTION N/A 08/24/2016   Procedure: CESAREAN SECTION;  Surgeon: Florian Buff, MD;  Location: [facility];  Service: Obstetrics;  Laterality: N/A;   CESAREAN SECTION WITH BILATERAL TUBAL LIGATION Bilateral 08/21/2017   Procedure: CESAREAN SECTION WITH BILATERAL TUBAL LIGATION;  Surgeon: Mora Bellman, MD;  Location: [facility];  Service: Obstetrics;  Laterality: Bilateral;  IUD ROMAL   COLPOSCOPY  01/2011    OB History     Gravida  6   Para  4   Term  4   Preterm      AB  2   Living  4      SAB      IAB  2   Ectopic      Multiple  0   Live Births  4            Home Medications     Prior to Admission medications   Medication Sig Start Date End Date Taking? Authorizing Provider  Semaglutide-Weight Management (WEGOVY) 0.25 MG/0.5ML SOAJ Inject 0.25 mg into the skin once a week. 07/26/22   Haydee Salter, MD  valACYclovir (VALTREX) 500 MG tablet Take 1 tablet (500 mg total) by mouth 2 (two) times daily. 07/07/22   Shelly Bombard, MD    Family History Family History  Problem Relation Age of Onset   Diabetes Mother    Hypertension Father     Social History Social History   Tobacco Use   Smoking status: Never   Smokeless tobacco: Never  Vaping Use   Vaping Use: Never used  Substance Use Topics   Alcohol use: No   Drug use: No     Allergies   Patient has no known allergies.   Review of Systems Review of Systems  Genitourinary:  Positive for dysuria.     Physical Exam Triage Vital Signs ED Triage Vitals  Enc Vitals Group     BP 10/01/22 1215 119/73     Pulse Rate 10/01/22 1215 71     Resp 10/01/22 1215 16     Temp 10/01/22 1215 98.8 F (  37.1 C)     Temp Source 10/01/22 1215 Oral     SpO2 10/01/22 1215 99 %     Weight --      Height --      Head Circumference --      Peak Flow --      Pain Score 10/01/22 1218 7     Pain Loc --      Pain Edu? --      Excl. in [place]? --    No data found.  Updated Vital Signs BP 119/73 (BP Location: Left Arm)   Pulse 71   Temp 98.8 F (37.1 C) (Oral)   Resp 16   SpO2 99%   Visual Acuity Right Eye Distance:   Left Eye Distance:   Bilateral Distance:    Right Eye Near:   Left Eye Near:    Bilateral Near:     Physical Exam Vitals reviewed.  Constitutional:      Appearance: Normal appearance.  Cardiovascular:     Rate and Rhythm: Normal rate and regular rhythm.     Pulses: Normal pulses.     Heart sounds: Normal heart sounds.  Pulmonary:     Effort: Pulmonary effort is normal.     Breath sounds: Normal breath sounds.  Skin:    General: Skin is warm and dry.  Neurological:      General: No focal deficit present.     Mental Status: She is alert and oriented to person, place, and time.  Psychiatric:        Mood and Affect: Mood normal.        Behavior: Behavior normal.      UC Treatments / Results  Labs (all labs ordered are listed, but only abnormal results are displayed) Labs Reviewed  POCT URINALYSIS DIP (MANUAL ENTRY) - Abnormal; Notable for the following components:      Result Value   Color, UA orange (*)    Clarity, UA cloudy (*)    Glucose, UA =100 (*)    Bilirubin, UA small (*)    Ketones, POC UA trace (5) (*)    Spec Grav, UA >=1.030 (*)    Blood, UA trace-intact (*)    Protein Ur, POC =30 (*)    Nitrite, UA Positive (*)    All other components within normal limits  CERVICOVAGINAL ANCILLARY ONLY    EKG   Radiology No results found.  Procedures Procedures (including critical care time)  Medications Ordered in UC Medications - No data to display  Initial Impression / Assessment and Plan / UC Course  I have reviewed the triage vital signs and the nursing notes.  Pertinent labs & imaging results that were available during my care of the patient were reviewed by me and considered in my medical decision making (see chart for details).   Patient is afebrile here without recent antipyretics. Satting well on room air. Overall is well appearing, well hydrated, without respiratory distress. Pulmonary exam is unremarkable.  Lungs CTAB without wheezing, rhonchi, rales.  UA is not indicative of UTI.  There are no leukocytes.  Urine is cloudy and nitrite positive.  Will send for confirmatory culture.  Specific gravity is greater than 1.03 so possibly some dehydration.  Awaiting results of vaginal cytology.  Her respiratory symptoms are consistent for an acute viral process, resolving.  Recommending continued use of OTC medication for symptom control.  In addition, will prescribe benzonatate to help her with her cough.   Final Clinical  Impressions(s) / UC Diagnoses   Final diagnoses:  Vaginal itching   Discharge Instructions   None    ED Prescriptions   None    PDMP not reviewed this encounter.   Rose Phi, [facility] 10/01/22 1248

## 2022-10-01 NOTE — Discharge Instructions (Addendum)
Follow up here or with your primary care provider if your symptoms are worsening or not improving.    

## 2022-10-02 LAB — URINE CULTURE: Culture: NO GROWTH

## 2022-10-03 ENCOUNTER — Telehealth: Payer: Self-pay

## 2022-10-03 ENCOUNTER — Telehealth: Payer: Self-pay | Admitting: Family Medicine

## 2022-10-03 ENCOUNTER — Ambulatory Visit: Payer: Commercial Managed Care - PPO | Admitting: Family Medicine

## 2022-10-03 LAB — CERVICOVAGINAL ANCILLARY ONLY
Bacterial Vaginitis (gardnerella): POSITIVE — AB
Candida Glabrata: NEGATIVE
Candida Vaginitis: POSITIVE — AB
Chlamydia: NEGATIVE
Comment: NEGATIVE
Comment: NEGATIVE
Comment: NEGATIVE
Comment: NEGATIVE
Comment: NEGATIVE
Comment: NORMAL
Neisseria Gonorrhea: NEGATIVE
Trichomonas: NEGATIVE

## 2022-10-03 NOTE — Telephone Encounter (Signed)
3.4.24 no show letter sent

## 2022-10-04 ENCOUNTER — Telehealth (HOSPITAL_COMMUNITY): Payer: Self-pay | Admitting: Emergency Medicine

## 2022-10-04 MED ORDER — FLUCONAZOLE 150 MG PO TABS: 150.0000 mg | ORAL_TABLET | Freq: Once | ORAL | 0 refills | Status: AC

## 2022-10-04 MED ORDER — METRONIDAZOLE 500 MG PO TABS: 500.0000 mg | ORAL_TABLET | Freq: Two times a day (BID) | ORAL | 0 refills | Status: DC

## 2022-10-04 NOTE — Telephone Encounter (Signed)
Opened in error

## 2022-10-11 ENCOUNTER — Encounter: Payer: Self-pay | Admitting: Family Medicine

## 2022-10-11 NOTE — Telephone Encounter (Signed)
2nd no show, fee generated, final warning letter sent via mail and mychart to reschedule/notify of policy  No show [id number] and 10/03/2022

## 2022-10-24 ENCOUNTER — Encounter (INDEPENDENT_AMBULATORY_CARE_PROVIDER_SITE_OTHER): Payer: BLUE CROSS/BLUE SHIELD | Admitting: Family Medicine

## 2022-10-31 ENCOUNTER — Telehealth: Payer: Self-pay | Admitting: Family Medicine

## 2022-10-31 ENCOUNTER — Ambulatory Visit: Payer: Commercial Managed Care - PPO | Admitting: Family Medicine

## 2022-10-31 NOTE — Telephone Encounter (Signed)
Patient/Caregiver was notified of No Show/Late Cancellation Policy & possible [id number] charge. Visit was cancelled with reason "No Show/Cancel within 24 hours" for tracking & charging.  Caller Name: Lizamarie Kevorkian Ph #: [phone] Date of APPT: 4/1 Reason given for no show/late cancellation: No show No Show Letter printed & put in outgoing mail (Yes/No): yes  ~~~Route message to admin supervisor and clinical team/CMA~~~

## 2022-11-01 NOTE — Telephone Encounter (Signed)
Wrong Provider

## 2022-11-03 NOTE — Telephone Encounter (Signed)
3rd no show, fee generated   You seen pt 1 time 07/26/2022  No show 08/30/22, 10/03/22, and 10/31/22  Do you want me to proceed with dismissal?

## 2022-11-04 ENCOUNTER — Encounter: Payer: Self-pay | Admitting: Family Medicine

## 2022-11-04 NOTE — Telephone Encounter (Signed)
Generated dismissal letter 

## 2022-11-11 ENCOUNTER — Telehealth: Payer: Self-pay | Admitting: Family Medicine

## 2022-11-11 NOTE — Telephone Encounter (Signed)
I returned call about dismissal.  Pt states on 10/01/2022 she called our number and talked to nurse triage. They advised her with her symptoms of UTI/bacterial infection to go to UC/ER. Pt went to [facility] 10/01/2022 and assumed we were notified since she called [place] Saturday 3/2. I have removed the no show for that day.   Pt states on 10/31/2022 she called our office and told us she had her cycle and not comfortable coming in for visit for vaginal infection. She said she was told she could still come but it made her uncomfortable.   I have reversed dismissal and advised pt that if she were to miss another appt, we would dismiss. This encompasses same day cancellation, late arrival, or a no show. Pt displayed understanding.  Pt scheduled for 4/15 visit with Dr. Veto Kemps for ongoing vaginal infection sx.

## 2022-11-11 NOTE — Telephone Encounter (Signed)
Caller Name: Paiden Call back phone #: [phone]  Reason for Call: Pt got a letter of dismissal and wants to discuss what can be done to change this. She has explanation of why she cancelled.

## 2022-11-14 ENCOUNTER — Encounter: Payer: Self-pay | Admitting: Family Medicine

## 2022-11-14 ENCOUNTER — Other Ambulatory Visit (HOSPITAL_COMMUNITY)
Admission: RE | Admit: 2022-11-14 | Discharge: 2022-11-14 | Disposition: A | Discharge status: Home / Self Care | Payer: Commercial Managed Care - PPO | Source: Ambulatory Visit | Attending: Family Medicine | Admitting: Family Medicine

## 2022-11-14 ENCOUNTER — Ambulatory Visit: Payer: Commercial Managed Care - PPO | Admitting: Family Medicine

## 2022-11-14 VITALS — BP 120/64 | HR 83 | Temp 97.0°F | Ht 62.5 in | Wt 185.6 lb

## 2022-11-14 DIAGNOSIS — N760 Acute vaginitis: Secondary | ICD-10-CM | POA: Diagnosis present

## 2022-11-14 LAB — POCT URINALYSIS DIPSTICK
Bilirubin, UA: NEGATIVE
Blood, UA: NEGATIVE
Glucose, UA: NEGATIVE
Ketones, UA: NEGATIVE
Nitrite, UA: NEGATIVE
Protein, UA: NEGATIVE
Spec Grav, UA: 1.025 (ref 1.010–1.025)
Urobilinogen, UA: 0.2 E.U./dL
pH, UA: 6 (ref 5.0–8.0)

## 2022-11-14 MED ORDER — FLUCONAZOLE 150 MG PO TABS: 150.0000 mg | ORAL_TABLET | Freq: Once | ORAL | 0 refills | Status: AC

## 2022-11-14 NOTE — Progress Notes (Signed)
[facility]ARE [facility] [address] [place] [address] Dept: [phone] Dept Fax: [phone]  Office Visit  Subjective:    Patient ID: Cheryl Stewart, female    DOB: Mar 17, 1986, 37 y.o..   MRN: [id number]  Chief Complaint  Patient presents with   Dysuria    C/o having pain with urination and vaginal itching x 2 weeks.    History of Present Illness:  Patient is in today complaining of a 2-week history of vaginal discharge and itching. She also notes some dysuria. She was seen in UC about 5 weeks ago and was treated for a yeast vaginitis and BV. Her symptoms did improve after treatment, but have now recurred.  Past Medical History: Patient Active Problem List   Diagnosis Date Noted   Class 1 obesity due to excess calories with body mass index (BMI) of 34.0 to 34.9 in adult 07/26/2022   Chronic constipation 07/26/2022   Genital herpes 07/26/2022   ASCUS of cervix with negative high risk HPV 08/21/2017   S/P repeat low transverse C-section 08/25/2016   History of syphilis 04/06/2016   Uterine fibroid 03/14/2016   Past Surgical History:  Procedure Laterality Date   CESAREAN SECTION  2009   CESAREAN SECTION N/A 04/22/2014   Procedure: CESAREAN SECTION;  Surgeon: Kathreen Cosier, MD;  Location: [facility];  Service: Obstetrics;  Laterality: N/A;   CESAREAN SECTION N/A 08/24/2016   Procedure: CESAREAN SECTION;  Surgeon: Lazaro Arms, MD;  Location: [facility]TH[facility] SUIT[facility];  Service: Obstetrics;  Laterality: N/A;   CESAREAN SECTION WITH BILATERAL TUBAL LIGATION Bilateral 08/21/2017   Procedure: CESAREAN SECTION WITH BILATERAL TUBAL LIGATION;  Surgeon: Catalina Antigua, MD;  Location: [facility];  Service: Obstetrics;  Laterality: Bilateral;  IUD ROMAL   COLPOSCOPY  01/2011   Family History  Problem Relation Age of Onset   Diabetes Mother    Hypertension Father    Outpatient Medications Prior to Visit  Medication Sig Dispense  Refill   valACYclovir (VALTREX) 500 MG tablet Take 1 tablet (500 mg total) by mouth 2 (two) times daily. 30 tablet 5   Semaglutide-Weight Management (WEGOVY) 0.25 MG/0.5ML SOAJ Inject 0.25 mg into the skin once a week. (Patient not taking: Reported on 11/14/2022) 2 mL 0   benzonatate (TESSALON) 100 MG capsule Take 1-2 tablets 3 times a day as needed for cough 30 capsule 0   metroNIDAZOLE (FLAGYL) 500 MG tablet Take 1 tablet (500 mg total) by mouth 2 (two) times daily. 14 tablet 0   No facility-administered medications prior to visit.   No Known Allergies   Objective:   Today's Vitals   11/14/22 1458  BP: 120/64  Pulse: 83  Temp: (!) 97 F (36.1 C)  TempSrc: Temporal  SpO2: 98%  Weight: 185 lb 9.6 oz (84.2 kg)  Height: 5' 2.5" (1.588 m)   Body mass index is 33.41 kg/m.   General: Well developed, well nourished. No acute distress. GU: Normal external genitalia. Vaginal vault is pink with moderate white cheesy discharge. Cervix is pink   and appears normal.  Psych: Alert and oriented. Normal mood and affect.  There are no preventive care reminders to display for this patient.    Lab Results:    Component Ref Range & Units 15:10  Color, UA yellow  Clarity, UA clear  Glucose, UA Negative Negative  Bilirubin, UA neg  Ketones, UA neg  Spec Grav, UA 1.010 - 1.025 1.025  Blood, UA neg  pH,  UA 5.0 - 8.0 6.0  Protein, UA Negative Negative  Urobilinogen, UA 0.2 or 1.0 E.U./dL 0.2  Nitrite, UA neg  Leukocytes, UA Negative Large (3+) Abnormal     Assessment & Plan:   Problem List Items Addressed This Visit       Genitourinary   Acute vaginitis - Primary    Clinically, this appears to be a yeast infeciton. I will treat her with a course of Diflucan. Send swab for KOH and wet prep. UA may represent contamination. I will send for a culture.      Relevant Medications   fluconazole (DIFLUCAN) 150 MG tablet   Other Relevant Orders   POCT Urinalysis Dipstick (Completed)    Cervicovaginal ancillary only   Urine Culture   Return if symptoms worsen or fail to improve.   Loyola Mast, MD

## 2022-11-14 NOTE — Assessment & Plan Note (Signed)
Clinically, this appears to be a yeast infeciton. I will treat her with a course of Diflucan. Send swab for KOH and wet prep. UA may represent contamination. I will send for a culture.

## 2022-11-16 LAB — URINE CULTURE
MICRO NUMBER:: 14825178
SPECIMEN QUALITY:: ADEQUATE

## 2022-11-16 LAB — CERVICOVAGINAL ANCILLARY ONLY
Bacterial Vaginitis (gardnerella): NEGATIVE
Candida Glabrata: NEGATIVE
Candida Vaginitis: POSITIVE — AB
Comment: NEGATIVE
Comment: NEGATIVE
Comment: NEGATIVE
Comment: NEGATIVE
Trichomonas: NEGATIVE

## 2023-01-27 ENCOUNTER — Ambulatory Visit
Admission: EM | Admit: 2023-01-27 | Discharge: 2023-01-27 | Disposition: A | Discharge status: Home / Self Care | Payer: Commercial Managed Care - PPO | Attending: Emergency Medicine | Admitting: Emergency Medicine

## 2023-01-27 DIAGNOSIS — R300 Dysuria: Secondary | ICD-10-CM | POA: Insufficient documentation

## 2023-01-27 DIAGNOSIS — Z3202 Encounter for pregnancy test, result negative: Secondary | ICD-10-CM | POA: Diagnosis not present

## 2023-01-27 DIAGNOSIS — N898 Other specified noninflammatory disorders of vagina: Secondary | ICD-10-CM | POA: Insufficient documentation

## 2023-01-27 LAB — POCT URINALYSIS DIP (MANUAL ENTRY)
Glucose, UA: NEGATIVE mg/dL
Nitrite, UA: NEGATIVE
Protein Ur, POC: 100 mg/dL — AB
Spec Grav, UA: >=1.03 — AB (ref 1.010–1.025)
Urobilinogen, UA: 0.2 E.U./dL
pH, UA: 6 (ref 5.0–8.0)

## 2023-01-27 LAB — POCT URINE PREGNANCY: Preg Test, Ur: NEGATIVE

## 2023-01-27 MED ORDER — CEPHALEXIN 500 MG PO CAPS: 500.0000 mg | ORAL_CAPSULE | Freq: Two times a day (BID) | ORAL | 0 refills | Status: AC

## 2023-01-27 NOTE — ED Provider Notes (Signed)
[name]    CSN: [id number] Arrival date & time: 01/27/23  1628      History   Chief Complaint Chief Complaint  Patient presents with   Vaginal Discharge    HPI Cheryl Stewart is a 37 y.o. female.  Patient presents with 1 week history of vaginal discharge.  The vaginal discharge is cream colored and malodorous.  She has dysuria and urinary urgency since this morning.  She denies fever, chills, abdominal pain, flank pain, pelvic pain, or other symptoms.  No treatment at home.    The history is provided by the patient and medical records.    Past Medical History:  Diagnosis Date   Genital herpes    LGSIL (low grade squamous intraepithelial dysplasia) 07/12   Vaginal Pap smear, abnormal     Patient Active Problem List   Diagnosis Date Noted   Acute vaginitis 11/14/2022   Class 1 obesity due to excess calories with body mass index (BMI) of 34.0 to 34.9 in adult 07/26/2022   Chronic constipation 07/26/2022   Genital herpes 07/26/2022   ASCUS of cervix with negative high risk HPV 08/21/2017   S/P repeat low transverse C-section 08/25/2016   History of syphilis 04/06/2016   Uterine fibroid 03/14/2016    Past Surgical History:  Procedure Laterality Date   CESAREAN SECTION  2009   CESAREAN SECTION N/A 04/22/2014   Procedure: CESAREAN SECTION;  Surgeon: Kathreen Cosier, MD;  Location: [facility];  Service: Obstetrics;  Laterality: N/A;   CESAREAN SECTION N/A 08/24/2016   Procedure: CESAREAN SECTION;  Surgeon: Lazaro Arms, MD;  Location: [facility]TH[facility] SU[facility];  Service: Obstetrics;  Laterality: N/A;   CESAREAN SECTION WITH BILATERAL TUBAL LIGATION Bilateral 08/21/2017   Procedure: CESAREAN SECTION WITH BILATERAL TUBAL LIGATION;  Surgeon: Catalina Antigua, MD;  Location: [facility];  Service: Obstetrics;  Laterality: Bilateral;  IUD ROMAL   COLPOSCOPY  01/2011    OB History     Gravida  6   Para  4   Term  4   Preterm      AB  2   Living  4       SAB      IAB  2   Ectopic      Multiple  0   Live Births  4            Home Medications    Prior to Admission medications   Medication Sig Start Date End Date Taking? Authorizing Provider  cephALEXin (KEFLEX) 500 MG capsule Take 1 capsule (500 mg total) by mouth 2 (two) times daily for 5 days. 01/27/23 02/01/23 Yes Mickie Bail, NP  Semaglutide-Weight Management (WEGOVY) 0.25 MG/0.5ML SOAJ Inject 0.25 mg into the skin once a week. Patient not taking: Reported on 11/14/2022 07/26/22   Loyola Mast, MD  valACYclovir (VALTREX) 500 MG tablet Take 1 tablet (500 mg total) by mouth 2 (two) times daily. 07/07/22   Brock Bad, MD    Family History Family History  Problem Relation Age of Onset   Diabetes Mother    Hypertension Father     Social History Social History   Tobacco Use   Smoking status: Never   Smokeless tobacco: Never  Vaping Use   Vaping Use: Never used  Substance Use Topics   Alcohol use: No   Drug use: No     Allergies   Patient has no known allergies.   Review of Systems Review of Systems  Constitutional:  Negative for chills and fever.  Gastrointestinal:  Negative for abdominal pain, nausea and vomiting.  Genitourinary:  Positive for dysuria, urgency and vaginal discharge. Negative for flank pain, hematuria and pelvic pain.     Physical Exam Triage Vital Signs ED Triage Vitals  Enc Vitals Group     BP      Pulse      Resp      Temp      Temp src      SpO2      Weight      Height      Head Circumference      Peak Flow      Pain Score      Pain Loc      Pain Edu?      Excl. in GC?    No data found.  Updated Vital Signs BP 106/69   Pulse 78   Temp 97.8 F (36.6 C)   Resp 18   LMP 01/09/2023 (Approximate)   SpO2 99%   Visual Acuity Right Eye Distance:   Left Eye Distance:   Bilateral Distance:    Right Eye Near:   Left Eye Near:    Bilateral Near:     Physical Exam Vitals and nursing note reviewed.   Constitutional:      General: She is not in acute distress.    Appearance: She is well-developed.  HENT:     Mouth/Throat:     Mouth: Mucous membranes are moist.  Cardiovascular:     Rate and Rhythm: Normal rate and regular rhythm.     Heart sounds: Normal heart sounds.  Pulmonary:     Effort: Pulmonary effort is normal. No respiratory distress.     Breath sounds: Normal breath sounds.  Abdominal:     General: Bowel sounds are normal.     Palpations: Abdomen is soft.     Tenderness: There is no abdominal tenderness. There is no right CVA tenderness, left CVA tenderness, guarding or rebound.  Musculoskeletal:     Cervical back: Neck supple.  Skin:    General: Skin is warm and dry.  Neurological:     Mental Status: She is alert.  Psychiatric:        Mood and Affect: Mood normal.        Behavior: Behavior normal.      UC Treatments / Results  Labs (all labs ordered are listed, but only abnormal results are displayed) Labs Reviewed  POCT URINALYSIS DIP (MANUAL ENTRY) - Abnormal; Notable for the following components:      Result Value   Color, UA straw (*)    Clarity, UA cloudy (*)    Bilirubin, UA small (*)    Ketones, POC UA trace (5) (*)    Spec Grav, UA >=1.030 (*)    Blood, UA large (*)    Protein Ur, POC =100 (*)    Leukocytes, UA Small (1+) (*)    All other components within normal limits  URINE CULTURE  POCT URINE PREGNANCY  CERVICOVAGINAL ANCILLARY ONLY    EKG   Radiology No results found.  Procedures Procedures (including critical care time)  Medications Ordered in UC Medications - No data to display  Initial Impression / Assessment and Plan / UC Course  I have reviewed the triage vital signs and the nursing notes.  Pertinent labs & imaging results that were available during my care of the patient were reviewed by me and considered in my medical  decision making (see chart for details).    Vaginal discharge, dysuria, negative pregnancy test.   Treating with Keflex. Urine culture pending. Discussed with patient that we will call her if the urine culture shows the need to change or discontinue the antibiotic.  Patient obtained vaginal self swab for testing.  Discussed that we will call if test results are positive.  Discussed that she may require additional treatment at that time.  Instructed patient to abstain from sexual activity for at least 7 days.  Instructed her to follow-up with her PCP or gynecologist if her symptoms are not improving.  Patient agrees to plan of care.      Final Clinical Impressions(s) / UC Diagnoses   Final diagnoses:  Vaginal discharge  Dysuria  Negative pregnancy test     Discharge Instructions      Take the antibiotic as directed.  The urine culture is pending.  We will call you if it shows the need to change or discontinue your antibiotic.    Your vaginal tests are pending.  If your test results are positive, we will call you.  Do not have sexual activity for at least 7 days.    Follow up with your primary care provider if your symptoms are not improving.        ED Prescriptions     Medication Sig Dispense Auth. Provider   cephALEXin (KEFLEX) 500 MG capsule Take 1 capsule (500 mg total) by mouth 2 (two) times daily for 5 days. 10 capsule Mickie Bail, NP      PDMP not reviewed this encounter.   Mickie Bail, NP 01/27/23 [phone]

## 2023-01-27 NOTE — Discharge Instructions (Signed)
Take the antibiotic as directed.  The urine culture is pending.  We will call you if it shows the need to change or discontinue your antibiotic.     Your vaginal tests are pending.  If your test results are positive, we will call you.  Do not have sexual activity for at least 7 days.    Follow up with your primary care provider if your symptoms are not improving.    

## 2023-01-27 NOTE — ED Triage Notes (Signed)
Patient to Urgent Care with complaints of vaginal discharge and bladder fullness.   Symptoms started one week ago. Urinary urgency that started today.  Denies any concerns for STDs.

## 2023-01-28 LAB — URINE CULTURE: Culture: 30000 — AB

## 2023-01-30 LAB — CERVICOVAGINAL ANCILLARY ONLY
Bacterial Vaginitis (gardnerella): POSITIVE — AB
Candida Glabrata: NEGATIVE
Candida Vaginitis: POSITIVE — AB
Chlamydia: NEGATIVE
Comment: NEGATIVE
Comment: NEGATIVE
Comment: NEGATIVE
Comment: NEGATIVE
Comment: NEGATIVE
Comment: NORMAL
Neisseria Gonorrhea: NEGATIVE
Trichomonas: NEGATIVE

## 2023-01-31 ENCOUNTER — Telehealth (HOSPITAL_COMMUNITY): Payer: Self-pay | Admitting: Emergency Medicine

## 2023-01-31 MED ORDER — FLUCONAZOLE 150 MG PO TABS: 150.0000 mg | ORAL_TABLET | Freq: Once | ORAL | 0 refills | Status: AC

## 2023-01-31 MED ORDER — METRONIDAZOLE 500 MG PO TABS: 500.0000 mg | ORAL_TABLET | Freq: Two times a day (BID) | ORAL | 0 refills | Status: DC

## 2023-02-22 ENCOUNTER — Ambulatory Visit: Payer: Commercial Managed Care - PPO | Admitting: Family Medicine

## 2023-02-22 ENCOUNTER — Encounter: Payer: Self-pay | Admitting: Family Medicine

## 2023-02-22 VITALS — BP 118/66 | HR 79 | Temp 97.9°F | Ht 62.5 in | Wt 190.0 lb

## 2023-02-22 DIAGNOSIS — R5383 Other fatigue: Secondary | ICD-10-CM

## 2023-02-22 NOTE — Progress Notes (Signed)
[facility] [address] [place] [address] Dept: [phone] Dept Fax: [phone]  Office Visit  Subjective:    Patient ID: Cheryl Stewart, female    DOB: 10/22/1985, 37 y.o..   MRN: [id number]  Chief Complaint  Patient presents with   Fatigue    C/o having fatigue x 1 month.  ? Iron levels.    History of Present Illness:  Patient is in today for complaining of a 46-month history of fatigue. She notes a general lack of energy and a feeling that her metabolism is low. She admits to chronic constipation, difficulty with weight loss, and +/- some cold intolerance. She has a history of anemia and a uterine fibroid. She doesn't feel her menses has been particularly heavy recently.  Past Medical History: Patient Active Problem List   Diagnosis Date Noted   Acute vaginitis 11/14/2022   Class 1 obesity due to excess calories with body mass index (BMI) of 34.0 to 34.9 in adult 07/26/2022   Chronic constipation 07/26/2022   Genital herpes 07/26/2022   ASCUS of cervix with negative high risk HPV 08/21/2017   S/P repeat low transverse C-section 08/25/2016   History of syphilis 04/06/2016   Uterine fibroid 03/14/2016   Past Surgical History:  Procedure Laterality Date   CESAREAN SECTION  2009   CESAREAN SECTION N/A 04/22/2014   Procedure: CESAREAN SECTION;  Surgeon: Kathreen Cosier, MD;  Location: [facility];  Service: Obstetrics;  Laterality: N/A;   CESAREAN SECTION N/A 08/24/2016   Procedure: CESAREAN SECTION;  Surgeon: Lazaro Arms, MD;  Location: [facility]TH[facility] SUIT[facility];  Service: Obstetrics;  Laterality: N/A;   CESAREAN SECTION WITH BILATERAL TUBAL LIGATION Bilateral 08/21/2017   Procedure: CESAREAN SECTION WITH BILATERAL TUBAL LIGATION;  Surgeon: Catalina Antigua, MD;  Location: [facility];  Service: Obstetrics;  Laterality: Bilateral;  IUD ROMAL   COLPOSCOPY  01/2011   Family History  Problem Relation Age of Onset    Diabetes Mother    Hypertension Father    Outpatient Medications Prior to Visit  Medication Sig Dispense Refill   valACYclovir (VALTREX) 500 MG tablet Take 1 tablet (500 mg total) by mouth 2 (two) times daily. 30 tablet 5   metroNIDAZOLE (FLAGYL) 500 MG tablet Take 1 tablet (500 mg total) by mouth 2 (two) times daily. 14 tablet 0   Semaglutide-Weight Management (WEGOVY) 0.25 MG/0.5ML SOAJ Inject 0.25 mg into the skin once a week. (Patient not taking: Reported on 11/14/2022) 2 mL 0   No facility-administered medications prior to visit.   No Known Allergies   Objective:   Today's Vitals   02/22/23 1405  BP: 118/66  Pulse: 79  Temp: 97.9 F (36.6 C)  TempSrc: Temporal  SpO2: 100%  Weight: 190 lb (86.2 kg)  Height: 5' 2.5" (1.588 m)   Body mass index is 34.2 kg/m.   General: Well developed, well nourished. No acute distress. Psych: Alert and oriented. Normal mood and affect.  There are no preventive care reminders to display for this patient.    Assessment & Plan:   Problem List Items Addressed This Visit       Other   Other fatigue - Primary    Etiology is unclear. She is at risk for anemia issues. She also has some symptoms that could be due to hypothyroidism. I will check labs to assess for common hematologic, endocrine, and vitamin etiologies of these symptoms.      Relevant Orders   CBC  TSH   T4, free   Comprehensive metabolic panel   Vitamin B12   Iron, TIBC and Ferritin Panel   VITAMIN D 25 Hydroxy (Vit-D Deficiency, Fractures)    Return if symptoms worsen or fail to improve.   Loyola Mast, MD

## 2023-02-22 NOTE — Assessment & Plan Note (Signed)
Etiology is unclear. She is at risk for anemia issues. She also has some symptoms that could be due to hypothyroidism. I will check labs to assess for common hematologic, endocrine, and vitamin etiologies of these symptoms.

## 2023-02-23 ENCOUNTER — Encounter: Payer: Self-pay | Admitting: Family Medicine

## 2023-02-23 DIAGNOSIS — E0590 Thyrotoxicosis, unspecified without thyrotoxic crisis or storm: Secondary | ICD-10-CM | POA: Insufficient documentation

## 2023-02-23 LAB — COMPREHENSIVE METABOLIC PANEL
ALT: 9 U/L (ref 0–35)
AST: 18 U/L (ref 0–37)
Albumin: 4 g/dL (ref 3.5–5.2)
Alkaline Phosphatase: 64 U/L (ref 39–117)
BUN: 9 mg/dL (ref 6–23)
CO2: 26 mEq/L (ref 19–32)
Calcium: 9.1 mg/dL (ref 8.4–10.5)
Chloride: 105 mEq/L (ref 96–112)
Creatinine, Ser: 0.64 mg/dL (ref 0.40–1.20)
GFR: 112.8 mL/min (ref >60.00)
Glucose, Bld: 82 mg/dL (ref 70–99)
Potassium: 3.7 mEq/L (ref 3.5–5.1)
Sodium: 137 mEq/L (ref 135–145)
Total Bilirubin: 0.4 mg/dL (ref 0.2–1.2)
Total Protein: 7 g/dL (ref 6.0–8.3)

## 2023-02-23 LAB — CBC
HCT: 36.3 % (ref 36.0–46.0)
Hemoglobin: 11.9 g/dL — ABNORMAL LOW (ref 12.0–15.0)
MCHC: 32.7 g/dL (ref 30.0–36.0)
MCV: 83.7 fl (ref 78.0–100.0)
Platelets: 174 10*3/uL (ref 150.0–400.0)
RBC: 4.33 Mil/uL (ref 3.87–5.11)
RDW: 14.1 % (ref 11.5–15.5)
WBC: 3.7 10*3/uL — ABNORMAL LOW (ref 4.0–10.5)

## 2023-02-23 LAB — IRON,TIBC AND FERRITIN PANEL
Ferritin: 7 ng/mL — ABNORMAL LOW (ref 16–154)
Iron: 68 ug/dL (ref 40–190)
TIBC: 415 mcg/dL (calc) (ref 250–450)

## 2023-02-23 LAB — VITAMIN D 25 HYDROXY (VIT D DEFICIENCY, FRACTURES): VITD: 11.25 ng/mL — ABNORMAL LOW (ref 30.00–100.00)

## 2023-02-23 LAB — VITAMIN B12: Vitamin B-12: 377 pg/mL (ref 211–911)

## 2023-02-23 LAB — TSH: TSH: 0.27 u[IU]/mL — ABNORMAL LOW (ref 0.35–5.50)

## 2023-09-08 ENCOUNTER — Ambulatory Visit: Payer: Commercial Managed Care - PPO

## 2023-09-09 ENCOUNTER — Ambulatory Visit: Payer: Commercial Managed Care - PPO

## 2023-10-05 ENCOUNTER — Ambulatory Visit
Admission: RE | Admit: 2023-10-05 | Discharge: 2023-10-05 | Disposition: A | Discharge status: Home / Self Care | Source: Ambulatory Visit | Attending: Emergency Medicine | Admitting: Emergency Medicine

## 2023-10-05 ENCOUNTER — Other Ambulatory Visit: Payer: Self-pay

## 2023-10-05 VITALS — BP 114/82 | HR 77 | Temp 97.3°F | Resp 16

## 2023-10-05 DIAGNOSIS — N898 Other specified noninflammatory disorders of vagina: Secondary | ICD-10-CM

## 2023-10-05 MED ORDER — METRONIDAZOLE 500 MG PO TABS: 500.0000 mg | ORAL_TABLET | Freq: Two times a day (BID) | ORAL | 0 refills | Status: DC

## 2023-10-05 MED ORDER — FLUCONAZOLE 150 MG PO TABS: 150.0000 mg | ORAL_TABLET | ORAL | 0 refills | Status: AC

## 2023-10-05 NOTE — Discharge Instructions (Addendum)
 Today you are being treated prophylactically for  Bacterial vaginosis and yeast based on your history  Take Metronidazole 500 mg twice a day for 7 days, do not drink alcohol while using medication, this will make you feel sick   1 Diflucan tablet the day you receive your medicine then take second dose in 7 days after completion of all antibiotics  Labs pending 2-3 days, you will be contacted if positive results  Please refrain from having sex until labs results, if positive please refrain from having sex until treatment complete and symptoms resolve   In addition: Avoid baths, hot tubs and whirlpool spas.  Don't use scented or harsh soaps Avoid irritants. These include scented tampons and pads. Wipe from front to back after using the toilet. Don't douche. Your vagina doesn't require cleansing other than normal bathing.  Use a condom.  Wear cotton underwear, this fabric absorbs some moisture.

## 2023-10-05 NOTE — ED Provider Notes (Signed)
 Cheryl Stewart    CSN: [id number] Arrival date & time: 10/05/23  0813      History   Chief Complaint Chief Complaint  Patient presents with   Vaginal Discharge    HPI Cheryl Stewart is a 38 y.o. female.   Patient presents for evaluation of vaginal discharge present for 3 weeks.  During the first week of symptoms experience vaginal itching and odor which have resolved.  Has begun to experience abdominal cramping 1 day ago.  Has not attempted treatment.  History of reoccurring BV and yeast.  No concern for STD.  Last menstrual period the end of February.  Past Medical History:  Diagnosis Date   Genital herpes    LGSIL (low grade squamous intraepithelial dysplasia) 07/12   Vaginal Pap smear, abnormal     Patient Active Problem List   Diagnosis Date Noted   Subclinical hyperthyroidism 02/23/2023   Other fatigue 02/22/2023   Acute vaginitis 11/14/2022   Class 1 obesity due to excess calories with body mass index (BMI) of 34.0 to 34.9 in adult 07/26/2022   Chronic constipation 07/26/2022   Genital herpes 07/26/2022   ASCUS of cervix with negative high risk HPV 08/21/2017   S/P repeat low transverse C-section 08/25/2016   History of syphilis 04/06/2016   Uterine fibroid 03/14/2016    Past Surgical History:  Procedure Laterality Date   CESAREAN SECTION  2009   CESAREAN SECTION N/A 04/22/2014   Procedure: CESAREAN SECTION;  Surgeon: Kathreen Cosier, MD;  Location: [facility];  Service: Obstetrics;  Laterality: N/A;   CESAREAN SECTION N/A 08/24/2016   Procedure: CESAREAN SECTION;  Surgeon: Lazaro Arms, MD;  Location: [facility] BIRTHING SUITES;  Service: Obstetrics;  Laterality: N/A;   CESAREAN SECTION WITH BILATERAL TUBAL LIGATION Bilateral 08/21/2017   Procedure: CESAREAN SECTION WITH BILATERAL TUBAL LIGATION;  Surgeon: Catalina Antigua, MD;  Location: [facility];  Service: Obstetrics;  Laterality: Bilateral;  IUD ROMAL   COLPOSCOPY  01/2011    OB History      Gravida  6   Para  4   Term  4   Preterm      AB  2   Living  4      SAB      IAB  2   Ectopic      Multiple  0   Live Births  4            Home Medications    Prior to Admission medications   Medication Sig Start Date End Date Taking? Authorizing Provider  fluconazole (DIFLUCAN) 150 MG tablet Take 1 tablet (150 mg total) by mouth once a week for 2 doses. 10/05/23 10/13/23 Yes Tome Wilson R, NP  metroNIDAZOLE (FLAGYL) 500 MG tablet Take 1 tablet (500 mg total) by mouth 2 (two) times daily. 10/05/23  Yes Sreekar Broyhill, Elita Boone, NP  valACYclovir (VALTREX) 500 MG tablet Take 1 tablet (500 mg total) by mouth 2 (two) times daily. 07/07/22   Brock Bad, MD    Family History Family History  Problem Relation Age of Onset   Diabetes Mother    Hypertension Father     Social History Social History   Tobacco Use   Smoking status: Never   Smokeless tobacco: Never  Vaping Use   Vaping status: Never Used  Substance Use Topics   Alcohol use: No   Drug use: No     Allergies   Patient has no known allergies.   Review  of Systems Review of Systems   Physical Exam Triage Vital Signs ED Triage Vitals  Encounter Vitals Group     BP 10/05/23 0820 114/82     Systolic BP Percentile --      Diastolic BP Percentile --      Pulse Rate 10/05/23 0820 77     Resp 10/05/23 0820 16     Temp 10/05/23 0820 (!) 97.3 F (36.3 C)     Temp Source 10/05/23 0820 Temporal     SpO2 10/05/23 0820 97 %     Weight --      Height --      Head Circumference --      Peak Flow --      Pain Score 10/05/23 0821 0     Pain Loc --      Pain Education --      Exclude from Growth Chart --    No data found.  Updated Vital Signs BP 114/82 (BP Location: Left Arm)   Pulse 77   Temp (!) 97.3 F (36.3 C) (Temporal)   Resp 16   LMP 09/20/2023 (Approximate)   SpO2 97%   Breastfeeding No   Visual Acuity Right Eye Distance:   Left Eye Distance:   Bilateral Distance:    Right  Eye Near:   Left Eye Near:    Bilateral Near:     Physical Exam Constitutional:      Appearance: Normal appearance.  Eyes:     Extraocular Movements: Extraocular movements intact.  Pulmonary:     Effort: Pulmonary effort is normal.  Genitourinary:    Comments: deferred Neurological:     Mental Status: She is alert and oriented to person, place, and time. Mental status is at baseline.      UC Treatments / Results  Labs (all labs ordered are listed, but only abnormal results are displayed) Labs Reviewed - No data to display  EKG   Radiology No results found.  Procedures Procedures (including critical care time)  Medications Ordered in UC Medications - No data to display  Initial Impression / Assessment and Plan / UC Course  I have reviewed the triage vital signs and the nursing notes.  Pertinent labs & imaging results that were available during my care of the patient were reviewed by me and considered in my medical decision making (see chart for details).  Vaginal discharge  Treating prophylactically for BV and yeast, metronidazole and Diflucan prescribed, discussed administration, advised abstinence during treatment from alcohol and any form of sexual intercourse, declined full STD testing, will may follow-up with her gynecologist as needed Final Clinical Impressions(s) / UC Diagnoses   Final diagnoses:  Vaginal discharge     Discharge Instructions      Today you are being treated prophylactically for  Bacterial vaginosis and yeast based on your history  Take Metronidazole 500 mg twice a day for 7 days, do not drink alcohol while using medication, this will make you feel sick   1 Diflucan tablet the day you receive your medicine then take second dose in 7 days after completion of all antibiotics  Labs pending 2-3 days, you will be contacted if positive results  Please refrain from having sex until labs results, if positive please refrain from having sex  until treatment complete and symptoms resolve   In addition: Avoid baths, hot tubs and whirlpool spas.  Don't use scented or harsh soaps Avoid irritants. These include scented tampons and pads. Wipe from front to  back after using the toilet. Don't douche. Your vagina doesn't require cleansing other than normal bathing.  Use a condom.  Wear cotton underwear, this fabric absorbs some moisture.        ED Prescriptions     Medication Sig Dispense Auth. Provider   metroNIDAZOLE (FLAGYL) 500 MG tablet Take 1 tablet (500 mg total) by mouth 2 (two) times daily. 14 tablet Jaymere Alen R, NP   fluconazole (DIFLUCAN) 150 MG tablet Take 1 tablet (150 mg total) by mouth once a week for 2 doses. 2 tablet Valinda Hoar, NP      PDMP not reviewed this encounter.   Valinda Hoar, [place] 10/05/23 [phone]

## 2023-10-05 NOTE — ED Triage Notes (Signed)
 Patient presents to [facility] for evaluation of vaginal discharge x 3 weeks.  Odor at the beginning but seems to be resolving.  Some lower abdominal cramping yesterday, nothing today

## 2023-10-06 LAB — CERVICOVAGINAL ANCILLARY ONLY
Bacterial Vaginitis (gardnerella): POSITIVE — AB
Candida Glabrata: NEGATIVE
Candida Vaginitis: POSITIVE — AB
Comment: NEGATIVE
Comment: NEGATIVE
Comment: NEGATIVE

## 2024-01-25 ENCOUNTER — Ambulatory Visit: Payer: Self-pay

## 2024-01-27 ENCOUNTER — Ambulatory Visit: Payer: Self-pay

## 2024-02-02 ENCOUNTER — Encounter: Payer: Self-pay | Admitting: Emergency Medicine

## 2024-02-02 ENCOUNTER — Ambulatory Visit
Admission: EM | Admit: 2024-02-02 | Discharge: 2024-02-02 | Disposition: A | Discharge status: Home / Self Care | Source: Ambulatory Visit

## 2024-02-02 DIAGNOSIS — N898 Other specified noninflammatory disorders of vagina: Secondary | ICD-10-CM | POA: Diagnosis not present

## 2024-02-02 NOTE — ED Provider Notes (Signed)
 [facility] [place]  Note:  This document was prepared using Conservation officer, historic buildings and may include unintentional dictation errors.  MRN: [id number] DOB: [birth year]  Subjective:   Cheryl Stewart is a 38 y.o. female presenting for thin cream-colored vaginal discharge x 1 month.  Patient denies any itching, irritation, pain, vaginal lesion, abdominal pain, flank pain.  Patient denies dysuria or increased urinary frequency.  Patient has had recent infection with bacterial vaginosis and states that symptoms are very similar.  Patient has not tried any over-the-counter medication to treat symptoms.  Patient would like STD and cervical vaginal swab collection today in UC to evaluate for BV, yeast, trichomonas, gonorrhea chlamydia.  No current facility-administered medications for this encounter.  Current Outpatient Medications:    metroNIDAZOLE  (FLAGYL ) 500 MG tablet, Take 1 tablet (500 mg total) by mouth 2 (two) times daily., Disp: 14 tablet, Rfl: 0   valACYclovir  (VALTREX ) 500 MG tablet, Take 1 tablet (500 mg total) by mouth 2 (two) times daily., Disp: 30 tablet, Rfl: 5   No Known Allergies  Past Medical History:  Diagnosis Date   Genital herpes    LGSIL (low grade squamous intraepithelial dysplasia) 07/12   Vaginal Pap smear, abnormal      Past Surgical History:  Procedure Laterality Date   CESAREAN SECTION  2009   CESAREAN SECTION N/A 04/22/2014   Procedure: CESAREAN SECTION;  Surgeon: Aida DELENA Na, MD;  Location: [facility];  Service: Obstetrics;  Laterality: N/A;   CESAREAN SECTION N/A 08/24/2016   Procedure: CESAREAN SECTION;  Surgeon: Vonn VEAR Inch, MD;  Location: [facility] BIRTHING SUITES;  Service: Obstetrics;  Laterality: N/A;   CESAREAN SECTION WITH BILATERAL TUBAL LIGATION Bilateral 08/21/2017   Procedure: CESAREAN SECTION WITH BILATERAL TUBAL LIGATION;  Surgeon: Alger Gong, MD;  Location: [facility];  Service: Obstetrics;  Laterality: Bilateral;  IUD  ROMAL   COLPOSCOPY  01/2011    Family History  Problem Relation Age of Onset   Diabetes Mother    Hypertension Father     Social History   Tobacco Use   Smoking status: Never   Smokeless tobacco: Never  Vaping Use   Vaping status: Never Used  Substance Use Topics   Alcohol use: Yes   Drug use: No    ROS Refer to HPI for ROS details.  Objective:   Vitals: BP 107/69 (BP Location: Left Arm)   Pulse 78   Temp 98.3 F (36.8 C) (Oral)   Resp 18   SpO2 100%   Physical Exam Vitals and nursing note reviewed.  Constitutional:      General: She is not in acute distress.    Appearance: Normal appearance. She is well-developed. She is not ill-appearing or toxic-appearing.  HENT:     Head: Normocephalic and atraumatic.  Cardiovascular:     Rate and Rhythm: Normal rate.  Pulmonary:     Effort: Pulmonary effort is normal. No respiratory distress.  Abdominal:     General: There is no distension.     Palpations: Abdomen is soft.     Tenderness: There is no abdominal tenderness. There is no right CVA tenderness, left CVA tenderness, guarding or rebound.  Genitourinary:    Vagina: Vaginal discharge present.  Skin:    General: Skin is warm and dry.  Neurological:     General: No focal deficit present.     Mental Status: She is alert and oriented to person, place, and time.  Psychiatric:  Mood and Affect: Mood normal.        Behavior: Behavior normal.     Procedures  No results found for this or any previous visit (from the past 24 hours).  No results found.   Assessment and Plan :     Discharge Instructions       1. Vaginal discharge (Primary) - Cervicovaginal swab collected in UC and sent to lab for further testing results should be available in 2 to 3 days.  If any abnormal findings on the final report you will be contacted and appropriate treatment provided. -As discussed, if swab is positive for bacterial vaginosis and you have had multiple BV  infections in the recent months follow-up with gynecology for further investigation of causes of bacterial vaginosis infections and ongoing treatment may be recommended. - Continue to monitor symptoms for any change in severity if there is any escalation of current symptoms or development of new symptoms follow-up for further evaluation and treatment.      Ladaisha Portillo B Beya Tipps   Damarie [place], [place], [place] 02/02/24 1430

## 2024-02-02 NOTE — Discharge Instructions (Addendum)
  1. Vaginal discharge (Primary) - Cervicovaginal swab collected in UC and sent to lab for further testing results should be available in 2 to 3 days.  If any abnormal findings on the final report you will be contacted and appropriate treatment provided. -As discussed, if swab is positive for bacterial vaginosis and you have had multiple BV infections in the recent months follow-up with gynecology for further investigation of causes of bacterial vaginosis infections and ongoing treatment may be recommended. - Continue to monitor symptoms for any change in severity if there is any escalation of current symptoms or development of new symptoms follow-up for further evaluation and treatment.

## 2024-02-02 NOTE — ED Triage Notes (Signed)
 Patient complains of creamy color vaginal discharge x 1 month. Denies pain.

## 2024-02-05 LAB — CERVICOVAGINAL ANCILLARY ONLY
Bacterial Vaginitis (gardnerella): POSITIVE — AB
Candida Glabrata: NEGATIVE
Candida Vaginitis: POSITIVE — AB
Chlamydia: NEGATIVE
Comment: NEGATIVE
Comment: NEGATIVE
Comment: NEGATIVE
Comment: NEGATIVE
Comment: NEGATIVE
Comment: NORMAL
Neisseria Gonorrhea: NEGATIVE
Trichomonas: NEGATIVE

## 2024-02-06 ENCOUNTER — Ambulatory Visit (HOSPITAL_COMMUNITY): Payer: Self-pay

## 2024-02-06 DIAGNOSIS — B3731 Acute candidiasis of vulva and vagina: Secondary | ICD-10-CM

## 2024-02-06 DIAGNOSIS — B9689 Other specified bacterial agents as the cause of diseases classified elsewhere: Secondary | ICD-10-CM

## 2024-02-06 MED ORDER — METRONIDAZOLE 500 MG PO TABS: 500.0000 mg | ORAL_TABLET | Freq: Two times a day (BID) | ORAL | 0 refills | Status: AC

## 2024-02-06 MED ORDER — FLUCONAZOLE 150 MG PO TABS: 150.0000 mg | ORAL_TABLET | Freq: Once | ORAL | 0 refills | Status: AC

## 2024-02-09 NOTE — Telephone Encounter (Signed)
 Pt states she was told by the provider that medication could also be sent in for her husband if her BV test was positive.  Please advise. Thanks.

## 2024-02-12 MED ORDER — METRONIDAZOLE 500 MG PO TABS: 500.0000 mg | ORAL_TABLET | Freq: Two times a day (BID) | ORAL | 0 refills | Status: DC

## 2024-02-12 MED ORDER — FLUCONAZOLE 150 MG PO TABS: 150.0000 mg | ORAL_TABLET | Freq: Every day | ORAL | 0 refills | Status: DC

## 2024-02-12 NOTE — Telephone Encounter (Signed)
-----   Message from Alfonso GORMAN Bailey, RN sent at 02/09/2024 12:08 PM EDT -----   ----- Message ----- From: Interface, Lab In Three Zero One Sent: 02/05/2024   2:59 PM EDT To: [facility] Follow Up

## 2024-03-14 ENCOUNTER — Ambulatory Visit: Payer: Self-pay

## 2024-03-14 ENCOUNTER — Ambulatory Visit: Admitting: Family Medicine

## 2024-03-14 ENCOUNTER — Ambulatory Visit

## 2024-03-14 VITALS — BP 107/67 | HR 78 | Temp 97.6°F | Resp 18 | Wt 194.6 lb

## 2024-03-14 DIAGNOSIS — Z6834 Body mass index (BMI) 34.0-34.9, adult: Secondary | ICD-10-CM | POA: Diagnosis not present

## 2024-03-14 DIAGNOSIS — M79642 Pain in left hand: Secondary | ICD-10-CM

## 2024-03-14 DIAGNOSIS — E66811 Obesity, class 1: Secondary | ICD-10-CM

## 2024-03-14 DIAGNOSIS — E6609 Other obesity due to excess calories: Secondary | ICD-10-CM

## 2024-03-14 NOTE — Progress Notes (Signed)
 " Assessment & Plan   Assessment/Plan:    Assessment & Plan Left hand pain and numbness Intermittent left hand pain and numbness for four months, worsening over time. Pain localized at the base of the thumb, with numbness extending to the whole arm, primarily at night. Possible nerve compression or carpal tunnel syndrome. Differential includes ulnar tunnel syndrome, medial or radial nerve compression, or cervical radiculopathy. No evidence of vascular compromise. Pain rated 8/10 during episodes, but tolerable without medication. No specific triggers identified. Prefers non-surgical intervention and is concerned about potential nerve compression. - Refer to sports medicine for further evaluation and management. - Recommend carpal tunnel wrist brace for nighttime use to alleviate symptoms. - Advise against wearing tight bracelets or watches. - Instruct on wrist exercises, including flexion and extension, to be done three times a day. - Consider x-ray of the neck if symptoms persist or worsen.  Obesity BMI of 35, indicating obesity. Previous prescription for Zepbound  was not filled due to cost. Now able to afford medication and requests a new prescription. Advised to follow up with primary care physician for comprehensive evaluation and potential prescription. Discussed the need for lab work prior to prescribing Zepbound . - Schedule follow-up appointment with primary care physician for physical examination and lab work prior to prescribing Zepbound .      There are no discontinued medications.  Return if symptoms worsen or fail to improve.        Subjective:   Encounter date: 03/14/2024  Cheryl Stewart is a 38 y.o. female who has Uterine fibroid; History of syphilis; S/P repeat low transverse C-section; ASCUS of cervix with negative high risk HPV; Class 1 obesity due to excess calories with body mass index (BMI) of 34.0 to 34.9 in adult; Chronic constipation; Genital herpes; Acute  vaginitis; Other fatigue; and Subclinical hyperthyroidism on their problem list..   She  has a past medical history of Genital herpes, LGSIL (low grade squamous intraepithelial dysplasia) (07/12), and Vaginal Pap smear, abnormal..   She presents with chief complaint of Hand Pain (Pt c/o of left hand pain and numbness due to injury for 4 months ago //HM due- vaccinations ) and Medication Refill (RX request for Zepbound  ) .   Discussed the use of AI scribe software for clinical note transcription with the patient, who gave verbal consent to proceed.  History of Present Illness Cheryl Stewart is a 38 year old female who presents with left hand pain and numbness following an injury.  She has been experiencing left hand pain and numbness for approximately four months after her hand was caught in a door. The pain is intermittent, primarily located at the base of her thumb, and is sometimes accompanied by a visible, prominent vein. The numbness occurs mostly at night and can involve the entire arm, though it does not extend to the neck. She rates the pain as severe, an eight out of ten, and describes it as focal. The symptoms have been progressively worsening.  She is seeking a prescription for Zepbound  for weight management, as she previously had a prescription but was unable to afford it. Her BMI is 35, indicating obesity. She has not had a physical examination in over a year and is due for follow-up and basic lab work.  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 She typically wears about three bracelets. She sometimes experiences neck pain.     ROS  Past Surgical History:  Procedure Laterality  Date   CESAREAN SECTION  2009   CESAREAN SECTION N/A 04/22/2014   Procedure: CESAREAN SECTION;  Surgeon: Aida DELENA Na, MD;  Location: [facility];  Service: Obstetrics;  Laterality: N/A;   CESAREAN SECTION N/A 08/24/2016   Procedure: CESAREAN SECTION;   Surgeon: Vonn VEAR Inch, MD;  Location: [facility] BIRTHING SUITES;  Service: Obstetrics;  Laterality: N/A;   CESAREAN SECTION WITH BILATERAL TUBAL LIGATION Bilateral 08/21/2017   Procedure: CESAREAN SECTION WITH BILATERAL TUBAL LIGATION;  Surgeon: Alger Gong, MD;  Location: [facility];  Service: Obstetrics;  Laterality: Bilateral;  IUD ROMAL   COLPOSCOPY  01/2011    Outpatient Medications Prior to Visit  Medication Sig Dispense Refill   fluconazole  (DIFLUCAN ) 150 MG tablet Take 1 tablet (150 mg total) by mouth daily. If symptoms persist take second dose of Diflucan  150 mg on day 3 to treat vaginal Candida. 2 tablet 0   metroNIDAZOLE  (FLAGYL ) 500 MG tablet Take 1 tablet (500 mg total) by mouth 2 (two) times daily. 14 tablet 0   metroNIDAZOLE  (FLAGYL ) 500 MG tablet Take 1 tablet (500 mg total) by mouth 2 (two) times daily. 14 tablet 0   valACYclovir  (VALTREX ) 500 MG tablet Take 1 tablet (500 mg total) by mouth 2 (two) times daily. 30 tablet 5   No facility-administered medications prior to visit.    Family History  Problem Relation Age of Onset   Diabetes Mother    Hypertension Father     Social History   Socioeconomic History   Marital status: Married    Spouse name: Not on file   Number of children: 4   Years of education: Not on file   Highest education level: Bachelor's degree (e.g., BA, AB, BS)  Occupational History   Occupation: Theatre Manager    Comment: [employer]  Tobacco Use   Smoking status: Never   Smokeless tobacco: Never  Vaping Use   Vaping status: Never Used  Substance and Sexual Activity   Alcohol use: Yes   Drug use: No   Sexual activity: Yes    Partners: Male    Birth control/protection: Surgical  Other Topics Concern   Not on file  Social History Narrative   Not on file   Social Drivers of Health   Financial Resource Strain: Not on file  Food Insecurity: Not on file  Transportation Needs: Not on file   Physical Activity: Not on file  Stress: Not on file  Social Connections: Not on file  Intimate Partner Violence: Not on file                                                                                                  Objective:  Physical Exam: BP 107/67 (BP Location: Left Arm, Patient Position: Sitting, Cuff Size: Large)   Pulse 78   Temp 97.6 F (36.4 C) (Temporal)   Resp 18   Wt 194 lb 9.6 oz (88.3 kg)   SpO2 96%   BMI 35.03 kg/m    Physical Exam MEASUREMENTS: BMI- 35.0. GENERAL: Alert, cooperative, well developed, no acute  distress. HEENT: Normocephalic, normal oropharynx, moist mucous membranes. CHEST: Clear to auscultation bilaterally, no wheezes, rhonchi, or crackles. CARDIOVASCULAR: Normal heart rate and rhythm, S1 and S2 normal without murmurs. ABDOMEN: Soft, non-tender, non-distended, without organomegaly, normal bowel sounds. EXTREMITIES: No cyanosis or edema. MUSCULOSKELETAL: Left hand examined, no abnormalities. Grip strength normal in left hand. Motor strength 5/5 in left arm. NEUROLOGICAL: Cranial nerves grossly intact, moves all extremities without gross motor or sensory deficit, sensation intact in left hand.   Physical Exam  No results found.  Recent Results (from the past 2160 hours)  Cervicovaginal ancillary only     Status: Abnormal   Collection Time: 02/02/24  2:37 PM  Result Value Ref Range   Neisseria Gonorrhea Negative    Chlamydia Negative    Trichomonas Negative    Bacterial Vaginitis (gardnerella) Positive (A)    Candida Vaginitis Positive (A)    Candida Glabrata Negative    Comment      Normal Reference Range Bacterial Vaginosis - Negative   Comment Normal Reference Range Candida Species - Negative    Comment Normal Reference Range Candida Galbrata - Negative    Comment Normal Reference Range Trichomonas - Negative    Comment Normal Reference Ranger Chlamydia - Negative    Comment      Normal Reference Range Neisseria Gonorrhea  - Negative        Beverley Adine Hummer, MD, MS "

## 2024-03-14 NOTE — Patient Instructions (Signed)
  VISIT SUMMARY: Today, you were seen for left hand pain and numbness that you have been experiencing for the past four months, as well as for weight management concerns. We discussed your symptoms, potential causes, and treatment options. Additionally, we addressed your request for a prescription for Zepbound for weight management.  YOUR PLAN: -LEFT HAND PAIN AND NUMBNESS: Your left hand pain and numbness may be due to nerve compression or carpal tunnel syndrome. This means that a nerve in your wrist or arm might be getting squeezed, causing pain and numbness. We recommend that you see a sports medicine specialist for further evaluation. In the meantime, you should use a carpal tunnel wrist brace at night, avoid wearing tight bracelets or watches, and perform wrist exercises three times a day. If your symptoms do not improve, we may consider getting an x-ray of your neck.  -OBESITY: Your BMI is 35, which indicates obesity. Obesity can lead to various health issues, so it's important to manage your weight. You mentioned that you can now afford Zepbound, a medication for weight management. We recommend that you schedule a follow-up appointment with your primary care physician for a physical examination and lab work before starting the medication.  INSTRUCTIONS: Please schedule a follow-up appointment with your primary care physician for a physical examination and lab work before we can prescribe Zepbound for weight management. Additionally, follow the recommendations for managing your left hand pain and numbness, and see a sports medicine specialist for further evaluation.

## 2024-03-15 ENCOUNTER — Encounter: Admitting: Family Medicine

## 2024-03-19 ENCOUNTER — Encounter: Payer: Self-pay | Admitting: Family Medicine

## 2024-04-02 ENCOUNTER — Encounter: Payer: Self-pay | Admitting: Family Medicine

## 2024-04-02 ENCOUNTER — Ambulatory Visit: Payer: Self-pay | Admitting: Family Medicine

## 2024-04-02 ENCOUNTER — Ambulatory Visit (INDEPENDENT_AMBULATORY_CARE_PROVIDER_SITE_OTHER): Admitting: Family Medicine

## 2024-04-02 VITALS — BP 116/72 | HR 74 | Temp 97.8°F | Ht 62.5 in | Wt 197.6 lb

## 2024-04-02 DIAGNOSIS — E559 Vitamin D deficiency, unspecified: Secondary | ICD-10-CM | POA: Diagnosis not present

## 2024-04-02 DIAGNOSIS — Z0000 Encounter for general adult medical examination without abnormal findings: Secondary | ICD-10-CM | POA: Diagnosis not present

## 2024-04-02 DIAGNOSIS — D509 Iron deficiency anemia, unspecified: Secondary | ICD-10-CM | POA: Diagnosis not present

## 2024-04-02 DIAGNOSIS — E782 Mixed hyperlipidemia: Secondary | ICD-10-CM | POA: Diagnosis not present

## 2024-04-02 DIAGNOSIS — Z131 Encounter for screening for diabetes mellitus: Secondary | ICD-10-CM | POA: Diagnosis not present

## 2024-04-02 DIAGNOSIS — E6609 Other obesity due to excess calories: Secondary | ICD-10-CM

## 2024-04-02 DIAGNOSIS — E0590 Thyrotoxicosis, unspecified without thyrotoxic crisis or storm: Secondary | ICD-10-CM

## 2024-04-02 DIAGNOSIS — E611 Iron deficiency: Secondary | ICD-10-CM

## 2024-04-02 DIAGNOSIS — K5909 Other constipation: Secondary | ICD-10-CM | POA: Diagnosis not present

## 2024-04-02 DIAGNOSIS — E785 Hyperlipidemia, unspecified: Secondary | ICD-10-CM | POA: Insufficient documentation

## 2024-04-02 DIAGNOSIS — Z6835 Body mass index (BMI) 35.0-35.9, adult: Secondary | ICD-10-CM

## 2024-04-02 DIAGNOSIS — E66812 Obesity, class 2: Secondary | ICD-10-CM

## 2024-04-02 LAB — CBC
HCT: 38.2 % (ref 36.0–46.0)
Hemoglobin: 12.5 g/dL (ref 12.0–15.0)
MCHC: 32.8 g/dL (ref 30.0–36.0)
MCV: 83.2 fl (ref 78.0–100.0)
Platelets: 249 K/uL (ref 150.0–400.0)
RBC: 4.59 Mil/uL (ref 3.87–5.11)
RDW: 14 % (ref 11.5–15.5)
WBC: 3.9 K/uL — ABNORMAL LOW (ref 4.0–10.5)

## 2024-04-02 LAB — BASIC METABOLIC PANEL WITH GFR
BUN: 8 mg/dL (ref 6–23)
CO2: 25 meq/L (ref 19–32)
Calcium: 9.1 mg/dL (ref 8.4–10.5)
Chloride: 103 meq/L (ref 96–112)
Creatinine, Ser: 0.61 mg/dL (ref 0.40–1.20)
GFR: 113.23 mL/min (ref >60.00)
Glucose, Bld: 74 mg/dL (ref 70–99)
Potassium: 3.6 meq/L (ref 3.5–5.1)
Sodium: 137 meq/L (ref 135–145)

## 2024-04-02 LAB — LIPID PANEL
Cholesterol: 237 mg/dL — ABNORMAL HIGH (ref 0–200)
HDL: 73.2 mg/dL (ref >39.00)
LDL Cholesterol: 149 mg/dL — ABNORMAL HIGH (ref 0–99)
NonHDL: 164.18
Total CHOL/HDL Ratio: 3
Triglycerides: 75 mg/dL (ref 0.0–149.0)
VLDL: 15 mg/dL (ref 0.0–40.0)

## 2024-04-02 LAB — T4, FREE: Free T4: 0.96 ng/dL (ref 0.60–1.60)

## 2024-04-02 LAB — TSH: TSH: 0.85 u[IU]/mL (ref 0.35–5.50)

## 2024-04-02 LAB — HEMOGLOBIN A1C: Hgb A1c MFr Bld: 5.7 % (ref 4.6–6.5)

## 2024-04-02 LAB — VITAMIN D 25 HYDROXY (VIT D DEFICIENCY, FRACTURES): VITD: 15.53 ng/mL — ABNORMAL LOW (ref 30.00–100.00)

## 2024-04-02 MED ORDER — TIRZEPATIDE-WEIGHT MANAGEMENT 12.5 MG/0.5ML ~~LOC~~ SOLN: 12.5000 mg | SUBCUTANEOUS | 11 refills | Status: AC

## 2024-04-02 MED ORDER — VITAMIN D (ERGOCALCIFEROL) 1.25 MG (50000 UNIT) PO CAPS: 50000.0000 [IU] | ORAL_CAPSULE | ORAL | 0 refills | Status: AC

## 2024-04-02 NOTE — Assessment & Plan Note (Signed)
I will reassess her Vitamin D level.

## 2024-04-02 NOTE — Progress Notes (Signed)
 [facility] [address] [place] [address] Dept: [phone] Dept Fax: [phone]  Annual Physical Visit  Subjective:    Patient ID: Cheryl Stewart, female    DOB: March 31, 1986, 38 y.o..   MRN: [id number]  Chief Complaint  Patient presents with   Annual Exam    CPE/labs. Fasting today.  No concerns.     History of Present Illness:  Patient is in today for an annual physical/preventative visit.  Review of Systems  Constitutional:  Negative for chills, diaphoresis, fever, malaise/fatigue and weight loss.  HENT:  Negative for congestion, ear pain, hearing loss, sinus pain, sore throat and tinnitus.   Eyes:  Negative for blurred vision, pain, discharge and redness.  Respiratory:  Negative for cough, shortness of breath and wheezing.   Cardiovascular:  Negative for chest pain and palpitations.  Gastrointestinal:  Positive for constipation. Negative for abdominal pain, diarrhea, heartburn, nausea and vomiting.       History of chronic constipation. She uses a Colo Cleanse on almost a daily basis to keep her bowels moving regularly.  Musculoskeletal:  Negative for back pain, joint pain and myalgias.  Skin:  Negative for itching and rash.  Psychiatric/Behavioral:  Negative for depression. The patient is not nervous/anxious.    Past Medical History: Patient Active Problem List   Diagnosis Date Noted   Hyperlipidemia 04/02/2024   Vitamin D  deficiency 04/02/2024   Iron  deficiency anemia 04/02/2024   Subclinical hyperthyroidism 02/23/2023   Other fatigue 02/22/2023   Acute vaginitis 11/14/2022   Class 2 obesity due to excess calories with body mass index (BMI) of 35.0 to 35.9 in adult 07/26/2022   Chronic constipation 07/26/2022   Genital herpes 07/26/2022   ASCUS of cervix with negative high risk HPV 08/21/2017   S/P repeat low transverse C-section 08/25/2016   History of syphilis 04/06/2016   Uterine fibroid 03/14/2016    Past Surgical History:  Procedure Laterality Date   CESAREAN SECTION  2009   CESAREAN SECTION N/A 04/22/2014   Procedure: CESAREAN SECTION;  Surgeon: Aida DELENA Na, MD;  Location: [facility];  Service: Obstetrics;  Laterality: N/A;   CESAREAN SECTION N/A 08/24/2016   Procedure: CESAREAN SECTION;  Surgeon: Vonn VEAR Inch, MD;  Location: [facility] BIRTHING SUIT[facility];  Service: Obstetrics;  Laterality: N/A;   CESAREAN SECTION WITH BILATERAL TUBAL LIGATION Bilateral 08/21/2017   Procedure: CESAREAN SECTION WITH BILATERAL TUBAL LIGATION;  Surgeon: Alger Gong, MD;  Location: [facility];  Service: Obstetrics;  Laterality: Bilateral;  IUD ROMAL   COLPOSCOPY  01/2011   Family History  Problem Relation Age of Onset   Diabetes Mother    Hypertension Father    Outpatient Medications Prior to Visit  Medication Sig Dispense Refill   valACYclovir  (VALTREX ) 500 MG tablet Take 1 tablet (500 mg total) by mouth 2 (two) times daily. (Patient taking differently: Take 500 mg by mouth daily as needed.) 30 tablet 5   fluconazole  (DIFLUCAN ) 150 MG tablet Take 1 tablet (150 mg total) by mouth daily. If symptoms persist take second dose of Diflucan  150 mg on day 3 to treat vaginal Candida. 2 tablet 0   metroNIDAZOLE  (FLAGYL ) 500 MG tablet Take 1 tablet (500 mg total) by mouth 2 (two) times daily. 14 tablet 0   metroNIDAZOLE  (FLAGYL ) 500 MG tablet Take 1 tablet (500 mg total) by mouth 2 (two) times daily. 14 tablet 0   No facility-administered medications prior to visit.   No Known Allergies Objective:  Today's Vitals   04/02/24 1003  BP: 116/72  Pulse: 74  Temp: 97.8 F (36.6 C)  TempSrc: Temporal  Weight: 197 lb 9.6 oz (89.6 kg)  Height: 5' 2.5 (1.588 m)   Body mass index is 35.57 kg/m.   General: Well developed, well nourished. No acute distress. HEENT: Normocephalic, non-traumatic. PERRL, EOMI. Conjunctiva clear. External ears normal. EAC and   TMs normal bilaterally. Nose clear without  congestion or rhinorrhea. Mucous membranes moist.   Oropharynx clear. Good dentition. Neck: Supple. No lymphadenopathy. No thyromegaly. Lungs: Clear to auscultation bilaterally. No wheezing, rales or rhonchi. CV: RRR without murmurs or rubs. Pulses 2+ bilaterally. Abdomen: Soft, non-tender. Bowel sounds positive, normal pitch and frequency. No hepatosplenomegaly.   No rebound or guarding. Extremities: Full ROM. No joint swelling or tenderness. No edema noted. Skin: Warm and dry. No rashes. Psych: Alert and oriented. Normal mood and affect.  Health Maintenance Due  Topic Date Due   HPV VACCINES (1 - 3-dose SCDM series) Never done     Assessment & Plan:   Problem List Items Addressed This Visit       Digestive   Chronic constipation   Discussed adding a fiber supplement to daily routine. Recommend use of Miralax if needed for daily use for constipation.        Endocrine   Subclinical hyperthyroidism   I will reassess her TSH and T4 today.       Relevant Orders   TSH (Completed)   T4, free (Completed)     Other   Annual physical exam - Primary   Overall health is good. Recommend regular exercise. Discussed recommended screenings and immunizations.       Class 2 obesity- Initial BMI 35.4   Discussed principles of weight management, including a foundation of a healthy, calorie-controlled diet and regular exercise. We did discuss the role of medication to augment dietary and exercise efforts. She has used semaglutide  (Wegovy ) in the past. Cheryl Stewart is interested din starting on tirzepatide  instead. We discussed options for reducing her cost to access tirzepatide  through [employer].       Hyperlipidemia   I will reassess her lipids today.      Relevant Orders   Lipid panel (Completed)   Iron  deficiency anemia   I will recheck a CBC and iron  profile.      Relevant Orders   Iron , TIBC and Ferritin Panel   CBC (Completed)   Vitamin D  deficiency   I will reassess  her Vitamin D  level.      Relevant Orders   VITAMIN D  25 Hydroxy (Vit-D Deficiency, Fractures) (Completed)   Other Visit Diagnoses       Screening for diabetes mellitus (DM)       Relevant Orders   Basic metabolic panel with GFR (Completed)   Hemoglobin A1c (Completed)       Return in about 2 months (around 06/02/2024) for Reassessment.   Garnette CHRISTELLA Simpler, MD

## 2024-04-02 NOTE — Assessment & Plan Note (Signed)
 I will reassess her lipids today.

## 2024-04-02 NOTE — Assessment & Plan Note (Signed)
 I will reassess her TSH and T4 today.

## 2024-04-02 NOTE — Assessment & Plan Note (Addendum)
 Discussed principles of weight management, including a foundation of a healthy, calorie-controlled diet and regular exercise. We did discuss the role of medication to augment dietary and exercise efforts. She has used semaglutide  (Wegovy ) in the past. Cheryl Stewart is interested din starting on tirzepatide  instead. We discussed options for reducing her cost to access tirzepatide  through [employer].

## 2024-04-02 NOTE — Patient Instructions (Signed)
Constipation: Our goal is to achieve formed bowel movements daily or every-other-day.  You may need to try different combinations of the following options to find what works best for you - everybody's body works differently so feel free to adjust the dosages as needed.  Some options to help maintain bowel health include:   Dietary changes (more leafy greens, vegetables and fruits; less processed foods) Fiber supplementation (Benefiber, FiberCon, Metamucil or Psyllium). Start slow and increase gradually to full dose. Over-the-counter agents such as: stool softeners (Docusate or Colace) and/or laxatives (Miralax, milk of magnesia)  "Power Pudding" is a natural mixture that may help your constipation.  To make blend 1 cup applesauce, 1 cup wheat bran, and 3/4 cup prune juice, refrigerate and then take 1 tablespoon daily with a large glass of water as needed.  Green Latte  2 cups of spinach 2 teaspoons of matcha (non-sweet)  1 and half cup of diary (almond milk normally) 1 banana Some chia seeds and honey  Blend well in a blender

## 2024-04-02 NOTE — Assessment & Plan Note (Signed)
 Overall health is good. Recommend regular exercise. Discussed recommended screenings and immunizations.

## 2024-04-02 NOTE — Assessment & Plan Note (Signed)
 Discussed adding a fiber supplement to daily routine. Recommend use of Miralax if needed for daily use for constipation.

## 2024-04-02 NOTE — Assessment & Plan Note (Signed)
 I will recheck a CBC and iron  profile.

## 2024-04-03 DIAGNOSIS — E611 Iron deficiency: Secondary | ICD-10-CM | POA: Insufficient documentation

## 2024-04-03 LAB — IRON,TIBC AND FERRITIN PANEL
%SAT: 13 % — ABNORMAL LOW (ref 16–45)
Ferritin: 8 ng/mL — ABNORMAL LOW (ref 16–154)
Iron: 56 ug/dL (ref 40–190)
TIBC: 425 ug/dL (ref 250–450)

## 2024-04-03 MED ORDER — FERROUS SULFATE 324 MG PO TBEC: 324.0000 mg | DELAYED_RELEASE_TABLET | Freq: Every day | ORAL | 1 refills | Status: AC

## 2024-06-24 ENCOUNTER — Other Ambulatory Visit: Payer: Self-pay | Admitting: Family Medicine

## 2024-06-24 DIAGNOSIS — E559 Vitamin D deficiency, unspecified: Secondary | ICD-10-CM

## 2024-07-11 ENCOUNTER — Other Ambulatory Visit: Payer: Self-pay

## 2024-07-11 DIAGNOSIS — A6000 Herpesviral infection of urogenital system, unspecified: Secondary | ICD-10-CM

## 2024-07-11 MED ORDER — VALACYCLOVIR HCL 500 MG PO TABS: 500.0000 mg | ORAL_TABLET | Freq: Two times a day (BID) | ORAL | 5 refills | Status: AC

## 2024-07-11 NOTE — Telephone Encounter (Signed)
 Refill request for  Valtrex  500 mg LR Hx provider LOV 04/02/24 FOV none scheduled  Please review and advise.  Thanks. Dm/cma
